# Patient Record
Sex: Male | Born: 1980 | Race: White | Hispanic: No | Marital: Single | Smoking: Former smoker
Health system: Southern US, Community
[De-identification: ages and names within clinical notes are randomized; demographics above are authoritative.]

## PROBLEM LIST (undated history)

## (undated) DIAGNOSIS — A630 Anogenital (venereal) warts: Secondary | ICD-10-CM

## (undated) DIAGNOSIS — K219 Gastro-esophageal reflux disease without esophagitis: Secondary | ICD-10-CM

## (undated) HISTORY — DX: Anogenital (venereal) warts: A63.0

## (undated) HISTORY — PX: TONSILLECTOMY: SUR1361

## (undated) HISTORY — DX: Gastro-esophageal reflux disease without esophagitis: K21.9

---

## 2005-03-22 ENCOUNTER — Ambulatory Visit: Payer: Self-pay | Admitting: Internal Medicine

## 2005-03-29 ENCOUNTER — Ambulatory Visit: Payer: Self-pay | Admitting: *Deleted

## 2005-03-29 ENCOUNTER — Ambulatory Visit: Payer: Self-pay | Admitting: Internal Medicine

## 2005-04-13 ENCOUNTER — Ambulatory Visit: Payer: Self-pay | Admitting: Internal Medicine

## 2005-07-05 ENCOUNTER — Ambulatory Visit: Payer: Self-pay | Admitting: Internal Medicine

## 2005-07-13 ENCOUNTER — Ambulatory Visit: Payer: Self-pay | Admitting: Internal Medicine

## 2005-07-15 ENCOUNTER — Ambulatory Visit: Payer: Self-pay | Admitting: Cardiology

## 2005-07-20 ENCOUNTER — Ambulatory Visit: Payer: Self-pay | Admitting: Family Medicine

## 2005-07-21 ENCOUNTER — Ambulatory Visit: Payer: Self-pay | Admitting: Internal Medicine

## 2005-08-10 ENCOUNTER — Ambulatory Visit: Payer: Self-pay | Admitting: Internal Medicine

## 2005-08-25 ENCOUNTER — Ambulatory Visit: Payer: Self-pay | Admitting: Internal Medicine

## 2015-03-12 ENCOUNTER — Other Ambulatory Visit: Payer: Self-pay | Admitting: Family Medicine

## 2015-03-12 NOTE — Telephone Encounter (Signed)
Needs appointment for PE.

## 2015-03-13 NOTE — Telephone Encounter (Signed)
Can you please get this patient scheduled for an appointment.

## 2015-03-17 ENCOUNTER — Other Ambulatory Visit: Payer: Self-pay | Admitting: Family Medicine

## 2015-03-17 DIAGNOSIS — Z0000 Encounter for general adult medical examination without abnormal findings: Secondary | ICD-10-CM

## 2015-03-18 ENCOUNTER — Encounter: Payer: Self-pay | Admitting: Family Medicine

## 2015-03-18 ENCOUNTER — Ambulatory Visit (INDEPENDENT_AMBULATORY_CARE_PROVIDER_SITE_OTHER): Payer: No Typology Code available for payment source | Admitting: Family Medicine

## 2015-03-18 VITALS — BP 138/96 | HR 98 | Temp 97.7°F | Ht 67.5 in | Wt 192.0 lb

## 2015-03-18 DIAGNOSIS — A630 Anogenital (venereal) warts: Secondary | ICD-10-CM | POA: Insufficient documentation

## 2015-03-18 DIAGNOSIS — K219 Gastro-esophageal reflux disease without esophagitis: Secondary | ICD-10-CM | POA: Insufficient documentation

## 2015-03-18 DIAGNOSIS — Z0000 Encounter for general adult medical examination without abnormal findings: Secondary | ICD-10-CM

## 2015-03-18 DIAGNOSIS — F524 Premature ejaculation: Secondary | ICD-10-CM | POA: Diagnosis not present

## 2015-03-18 DIAGNOSIS — N529 Male erectile dysfunction, unspecified: Secondary | ICD-10-CM | POA: Diagnosis not present

## 2015-03-18 DIAGNOSIS — K625 Hemorrhage of anus and rectum: Secondary | ICD-10-CM | POA: Diagnosis not present

## 2015-03-18 DIAGNOSIS — Z23 Encounter for immunization: Secondary | ICD-10-CM | POA: Diagnosis not present

## 2015-03-18 MED ORDER — OMEPRAZOLE 20 MG PO CPDR: 20.0000 mg | DELAYED_RELEASE_CAPSULE | Freq: Two times a day (BID) | ORAL | Status: DC

## 2015-03-18 NOTE — Assessment & Plan Note (Signed)
Patient is not interested in doing behavioral changes or the stop and squeeze. He states that they "don't work." Not interested in doing SSRI. Will check labs. Wants viagra. Discussed that this does not usually help the problem. He didn't want to discuss it further. If continues, consider referral to urology.

## 2015-03-18 NOTE — Assessment & Plan Note (Signed)
Likely the cause of his rectal bleeding. Will refer to general surgery for evaluation and treatment.

## 2015-03-18 NOTE — Progress Notes (Signed)
BP 138/96 mmHg  Pulse 98  Temp(Src) 97.7 F (36.5 C)  Ht 5' 7.5" (1.715 m)  Wt 192 lb (87.091 kg)  BMI 29.61 kg/m2  SpO2 98%   Subjective:    Patient ID: Jordan House, male    DOB: 12/24/1980, 34 y.o.   MRN: [id number]  HPI: Jordan House is a 34 y.o. male presenting on 03/18/2015 for comprehensive medical examination. Current medical complaints include:  ERECTILE DYSFUNCTION- sometimes finishes too soon,  ED status: years, every time Satisfied with current treatment?: not on anything   Medication side effects:  no Problem with getting erections: no Problem with sustaining erections: yes Poor erectile quality: no Frequency of dysfunction: constant Main problem: premature ejactulation Morning/nocturnal erections: yes Decreased libido: no New medications: none Relationship problems: no Good communication with sexual partner: yes  Satisfaction with current partner: yes  Treatments attempted: waiting  RECTAL BLEEDING Duration: has been going on for over 5 years, Has some genital warts down there Bright red rectal bleeding: yes Amount of blood: sometimes a lot, sometimes a little bit Frequency: once a week Melena: yes Spotting on toilet tissue: yes Anal fullness: yes Perianal pain: no Severity: moderate Perianal irritation/itching: yes Constipation: yes Chronic straining/valsava: yes Hemorrhoids: no Previous colonoscopy: none  GERD GERD control status: uncontrolled  Satisfied with current treatment? no Heartburn frequency: 1x a moth if not more often Medication side effects: no  Medication compliance: fair, comes and goes and sometimes is really bad Previous GERD medications: omeprazole Antacid use frequency: occasionally Duration: chronic Nature: indigestion, burning Location: LUQ Heartburn duration: 1 hour Alleviatiating factors: omeprazole  Aggravating factors: nothing Dysphagia: no Odynophagia:  no Hematemesis: no Blood in stool: yes EGD:  no  He currently lives with: his wife Interim Problems from his last visit: yes  Depression Screen done today and results listed below:  Depression screen [facility] 2/9 03/18/2015  Decreased Interest 0  Down, Depressed, Hopeless 0  PHQ - 2 Score 0    The patient does not have a history of falls. I did not complete a risk assessment for falls. A plan of care for falls was not documented.  Past Medical History:  Past Medical History  Diagnosis Date  . GERD (gastroesophageal reflux disease)   . Genital warts     Surgical History:  Past Surgical History  Procedure Laterality Date  . Tonsillectomy      Medications:  No current outpatient prescriptions on file prior to visit.   No current facility-administered medications on file prior to visit.    Allergies:  No Known Allergies  Social History:  Social History   Social History  . Marital Status: Single    Spouse Name: N/A  . Number of Children: N/A  . Years of Education: N/A   Occupational History  . Not on file.   Social History Main Topics  . Smoking status: Former Smoker    Quit date: 04/27/2011  . Smokeless tobacco: Not on file  . Alcohol Use: No  . Drug Use: No  . Sexual Activity: Yes    Birth Control/ Protection: None   Other Topics Concern  . Not on file   Social History Narrative  . No narrative on file   History  Smoking status  . Former Smoker  . Quit date: 04/27/2011  Smokeless tobacco  . Not on file   History  Alcohol Use No    Family History:  Family History  Problem Relation Age of Onset  . Arthritis Mother   .  Hypertension Father   . Diabetes Maternal Grandmother   . Arthritis Maternal Grandmother   . Heart disease Maternal Grandmother   . Hypertension Paternal Grandmother   . Heart disease Paternal Grandfather    Past medical history, surgical history, medications, allergies, family history and social history reviewed with patient today and changes made to appropriate areas of  the chart.   Review of Systems  Constitutional: Negative.   HENT: Negative for congestion, ear discharge, ear pain, hearing loss, nosebleeds, sore throat and tinnitus.   Eyes: Negative.   Respiratory: Negative.  Negative for stridor.   Cardiovascular: Negative.   Gastrointestinal: Positive for heartburn, abdominal pain, diarrhea, constipation and blood in stool. Negative for nausea, vomiting and melena.  Genitourinary: Negative.   Musculoskeletal: Negative.   Skin: Negative.   Neurological: Positive for headaches (1x a week). Negative for dizziness, tingling, tremors, sensory change, speech change, focal weakness, seizures and loss of consciousness.  Endo/Heme/Allergies: Negative.   Psychiatric/Behavioral: Negative.     All other ROS negative except what is listed above and in the HPI.      Objective:    BP 138/96 mmHg  Pulse 98  Temp(Src) 97.7 F (36.5 C)  Ht 5' 7.5" (1.715 m)  Wt 192 lb (87.091 kg)  BMI 29.61 kg/m2  SpO2 98%  Wt Readings from Last 3 Encounters:  03/18/15 195 lb (88.451 kg)  03/18/15 192 lb (87.091 kg)    Physical Exam  Constitutional: He is oriented to person, place, and time. He appears well-developed and well-nourished. No distress.  HENT:  Head: Normocephalic and atraumatic.  Right Ear: Hearing normal.  Left Ear: Hearing normal.  Nose: Nose normal.  Eyes: Conjunctivae and lids are normal. Right eye exhibits no discharge. Left eye exhibits no discharge. No scleral icterus.  Cardiovascular: Normal rate, regular rhythm, normal heart sounds and intact distal pulses.  Exam reveals no gallop and no friction rub.   No murmur heard. Pulmonary/Chest: Effort normal and breath sounds normal. No respiratory distress. He has no wheezes. He has no rales. He exhibits no tenderness.  Abdominal: Soft. Bowel sounds are normal. He exhibits no distension and no mass. There is no tenderness. There is no rebound and no guarding. Hernia confirmed negative in the right  inguinal area and confirmed negative in the left inguinal area.  Genitourinary: Testes normal.    Cremasteric reflex is present. Right testis shows no mass, no swelling and no tenderness. Right testis is descended. Cremasteric reflex is not absent on the right side. Left testis shows no mass, no swelling and no tenderness. Left testis is descended. Cremasteric reflex is not absent on the left side. Circumcised. No penile tenderness.     Flat warts on sides of penis and on anus  Musculoskeletal: Normal range of motion. He exhibits no edema or tenderness.  Lymphadenopathy:       Right: No inguinal adenopathy present.       Left: No inguinal adenopathy present.  Neurological: He is alert and oriented to person, place, and time. He has normal reflexes. He displays normal reflexes. No cranial nerve deficit. He exhibits normal muscle tone. Coordination normal.  Skin: Skin is warm, dry and intact. No rash noted. No erythema. No pallor.  Psychiatric: He has a normal mood and affect. His speech is normal and behavior is normal. Judgment and thought content normal. Cognition and memory are normal.  Nursing note and vitals reviewed.   No results found for this or any previous visit.  Assessment & Plan:   Problem List Items Addressed This Visit      Digestive   GERD (gastroesophageal reflux disease)    Still not under great control. Will increase to 40mg  daily and check back in in 1 month. If still going on, will refer to GI for further eval and likely EGD.      Relevant Medications   omeprazole (PRILOSEC) 20 MG capsule     Musculoskeletal and Integument   Condylomata acuminata in men    Likely the cause of his rectal bleeding. Will refer to general surgery for evaluation and treatment.       Relevant Orders   Ambulatory referral to General Surgery     Other   Premature ejaculation    Patient is not interested in doing behavioral changes or the stop and squeeze. He states that they  "don't work." Not interested in doing SSRI. Will check labs. Wants viagra. Discussed that this does not usually help the problem. He didn't want to discuss it further. If continues, consider referral to urology.       Other Visit Diagnoses    Routine general medical examination at a health care facility    -  Primary    Tdap tomorrow. Labs tomorrow when he's fasting. Work on diet and exercise. Continue to monitor.     Erectile dysfunction, unspecified erectile dysfunction type        Seems to be more premature ejaculation rather than ED. Will check labs. Information from up to date given to patient today.    Relevant Orders    Testosterone, free, total    Immunization due        Will get Tdap tomorrow. Refused flu.     Rectal bleeding        Likely to due the warts. Referral to general surgeon made today.       Discussed aspirin prophylaxis for myocardial infarction prevention and decision was it was not indicated  LABORATORY TESTING:  Health maintenance labs ordered today as discussed above.   IMMUNIZATIONS:   - Tdap: Tetanus vaccination status reviewed: to be done tomorrow. - Influenza: Refused  PATIENT COUNSELING:    Sexuality: Discussed sexually transmitted diseases, partner selection, use of condoms, avoidance of unintended pregnancy  and contraceptive alternatives.   Advised to avoid cigarette smoking.  I discussed with the patient that most people either abstain from alcohol or drink within safe limits (<=14/week and <=4 drinks/occasion for males, <=7/weeks and <= 3 drinks/occasion for females) and that the risk for alcohol disorders and other health effects rises proportionally with the number of drinks per week and how often a drinker exceeds daily limits.  Discussed cessation/primary prevention of drug use and availability of treatment for abuse.   Diet: Encouraged to adjust caloric intake to maintain  or achieve ideal body weight, to reduce intake of dietary saturated  fat and total fat, to limit sodium intake by avoiding high sodium foods and not adding table salt, and to maintain adequate dietary potassium and calcium preferably from fresh fruits, vegetables, and low-fat dairy products.    stressed the importance of regular exercise  Injury prevention: Discussed safety belts, safety helmets, smoke detector, smoking near bedding or upholstery.   Dental health: Discussed importance of regular tooth brushing, flossing, and dental visits.   Follow up plan: NEXT PREVENTATIVE PHYSICAL DUE IN 1 YEAR. Return in about 3 months (around 06/18/2015) for Follow up on GERD,  Tomorrow for labs.

## 2015-03-18 NOTE — Assessment & Plan Note (Signed)
Still not under great control. Will increase to 40mg  daily and check back in in 1 month. If still going on, will refer to GI for further eval and likely EGD.

## 2015-03-18 NOTE — Patient Instructions (Signed)

## 2015-03-19 ENCOUNTER — Encounter: Payer: Self-pay | Admitting: *Deleted

## 2015-03-19 ENCOUNTER — Other Ambulatory Visit: Payer: No Typology Code available for payment source

## 2015-03-19 DIAGNOSIS — Z0000 Encounter for general adult medical examination without abnormal findings: Secondary | ICD-10-CM

## 2015-03-19 DIAGNOSIS — N529 Male erectile dysfunction, unspecified: Secondary | ICD-10-CM

## 2015-03-19 LAB — UA/M W/RFLX CULTURE, ROUTINE
Bilirubin, UA: NEGATIVE
Glucose, UA: NEGATIVE
Ketones, UA: NEGATIVE
LEUKOCYTES UA: NEGATIVE
NITRITE UA: NEGATIVE
PH UA: 6 (ref 5.0–7.5)
Protein, UA: NEGATIVE
RBC, UA: NEGATIVE
Specific Gravity, UA: 1.025 (ref 1.005–1.030)
Urobilinogen, Ur: 0.2 mg/dL (ref 0.2–1.0)

## 2015-03-20 LAB — CBC WITH DIFFERENTIAL/PLATELET
BASOS ABS: 0 10*3/uL (ref 0.0–0.2)
Basos: 0 %
EOS (ABSOLUTE): 0.2 10*3/uL (ref 0.0–0.4)
Eos: 3 %
Hematocrit: 48.1 % (ref 37.5–51.0)
Hemoglobin: 16.1 g/dL (ref 12.6–17.7)
IMMATURE GRANULOCYTES: 0 %
Immature Grans (Abs): 0 10*3/uL (ref 0.0–0.1)
LYMPHS: 43 %
Lymphocytes Absolute: 3.1 10*3/uL (ref 0.7–3.1)
MCH: 28.2 pg (ref 26.6–33.0)
MCHC: 33.5 g/dL (ref 31.5–35.7)
MCV: 84 fL (ref 79–97)
MONOS ABS: 0.4 10*3/uL (ref 0.1–0.9)
Monocytes: 6 %
NEUTROS PCT: 48 %
Neutrophils Absolute: 3.5 10*3/uL (ref 1.4–7.0)
PLATELETS: 304 10*3/uL (ref 150–379)
RBC: 5.7 x10E6/uL (ref 4.14–5.80)
RDW: 14.1 % (ref 12.3–15.4)
WBC: 7.2 10*3/uL (ref 3.4–10.8)

## 2015-03-20 LAB — COMPREHENSIVE METABOLIC PANEL
A/G RATIO: 1.7 (ref 1.1–2.5)
ALT: 40 IU/L (ref 0–44)
AST: 27 IU/L (ref 0–40)
Albumin: 4.5 g/dL (ref 3.5–5.5)
Alkaline Phosphatase: 100 IU/L (ref 39–117)
BUN/Creatinine Ratio: 18 (ref 8–19)
BUN: 20 mg/dL (ref 6–20)
Bilirubin Total: 0.4 mg/dL (ref 0.0–1.2)
CALCIUM: 9.5 mg/dL (ref 8.7–10.2)
CO2: 25 mmol/L (ref 18–29)
CREATININE: 1.13 mg/dL (ref 0.76–1.27)
Chloride: 99 mmol/L (ref 97–106)
GFR, EST AFRICAN AMERICAN: 96 mL/min/{1.73_m2} (ref >59)
GFR, EST NON AFRICAN AMERICAN: 83 mL/min/{1.73_m2} (ref >59)
GLOBULIN, TOTAL: 2.7 g/dL (ref 1.5–4.5)
Glucose: 120 mg/dL — ABNORMAL HIGH (ref 65–99)
POTASSIUM: 4.4 mmol/L (ref 3.5–5.2)
Sodium: 139 mmol/L (ref 136–144)
TOTAL PROTEIN: 7.2 g/dL (ref 6.0–8.5)

## 2015-03-20 LAB — LIPID PANEL W/O CHOL/HDL RATIO
CHOLESTEROL TOTAL: 211 mg/dL — AB (ref 100–199)
HDL: 32 mg/dL — ABNORMAL LOW (ref >39)
LDL CALC: 121 mg/dL — AB (ref 0–99)
Triglycerides: 290 mg/dL — ABNORMAL HIGH (ref 0–149)
VLDL Cholesterol Cal: 58 mg/dL — ABNORMAL HIGH (ref 5–40)

## 2015-03-20 LAB — TSH: TSH: 3.47 u[IU]/mL (ref 0.450–4.500)

## 2015-03-23 LAB — TESTOSTERONE, FREE, TOTAL, SHBG: TESTOSTERONE FREE: 10.6 pg/mL (ref 8.7–25.1)

## 2015-03-25 ENCOUNTER — Telehealth: Payer: Self-pay | Admitting: Family Medicine

## 2015-03-25 MED ORDER — TESTOSTERONE 50 MG/5GM (1%) TD GEL: 5.0000 g | Freq: Every day | TRANSDERMAL | Status: DC

## 2015-03-25 NOTE — Telephone Encounter (Signed)
Attempted to call patient. Low testosterone. Will start on androgel. Cholesterol and sugar elevated. Work on diet and exercise and return in 1 month for recheck. Please let him know as I couldn't get through.

## 2015-03-27 NOTE — Telephone Encounter (Signed)
Patient called back, he will come get his Rx and schedule 1 month f/u with MJ.  Will work on diet and exercise between now and then.

## 2015-04-08 ENCOUNTER — Telehealth: Payer: Self-pay | Admitting: Family Medicine

## 2015-04-08 DIAGNOSIS — R7989 Other specified abnormal findings of blood chemistry: Secondary | ICD-10-CM

## 2015-04-08 NOTE — Telephone Encounter (Signed)
Forward to provider

## 2015-04-08 NOTE — Telephone Encounter (Signed)
Pt would like to know if there is anything else he can get instead of androgel because his insurance will not cover it.

## 2015-04-08 NOTE — Telephone Encounter (Signed)
We can refer him to urology, who can discuss further options. Referral generated

## 2015-04-15 ENCOUNTER — Ambulatory Visit: Payer: Self-pay | Admitting: General Surgery

## 2015-05-22 ENCOUNTER — Encounter: Payer: Self-pay | Admitting: *Deleted

## 2015-10-30 ENCOUNTER — Other Ambulatory Visit: Payer: Self-pay | Admitting: Family Medicine

## 2017-02-09 ENCOUNTER — Telehealth: Payer: Self-pay | Admitting: Family Medicine

## 2017-02-09 NOTE — Telephone Encounter (Signed)
FYI:  Patient requesting a refill on omeprazole medication. Patient was last seen 03/25/2015.Informed pt. He will need to come in for appt to be seen. Patient states he has no insurance and needs to come in this week due to being fully out of medication for omeprazole that he has been getting from another source.   Patient aware that he will be self-pay and self-pay policies.  Patient scheduled for visit with provider Friday 03/14/2017 at 3:30 pm.

## 2017-02-11 ENCOUNTER — Ambulatory Visit (INDEPENDENT_AMBULATORY_CARE_PROVIDER_SITE_OTHER): Payer: Self-pay | Admitting: Family Medicine

## 2017-02-11 ENCOUNTER — Encounter: Payer: Self-pay | Admitting: Family Medicine

## 2017-02-11 VITALS — BP 129/93 | HR 87 | Temp 98.0°F | Wt 194.0 lb

## 2017-02-11 DIAGNOSIS — K219 Gastro-esophageal reflux disease without esophagitis: Secondary | ICD-10-CM

## 2017-02-11 DIAGNOSIS — E291 Testicular hypofunction: Secondary | ICD-10-CM

## 2017-02-11 MED ORDER — TESTOSTERONE 50 MG/5GM (1%) TD GEL: 5.0000 g | Freq: Every day | TRANSDERMAL | 1 refills | Status: DC

## 2017-02-11 MED ORDER — OMEPRAZOLE 20 MG PO CPDR: DELAYED_RELEASE_CAPSULE | ORAL | 3 refills | Status: DC

## 2017-02-11 NOTE — Assessment & Plan Note (Signed)
Never filled his testosterone. Refill given today. Call with any concerns.

## 2017-02-11 NOTE — Addendum Note (Signed)
Addended by: Dorcas CarrowJOHNSON, Julis Haubner P on: 02/11/2017 04:23 PM   Modules accepted: Orders

## 2017-02-11 NOTE — Progress Notes (Signed)
BP (!) 129/93 (BP Location: Left Arm, Patient Position: Sitting, Cuff Size: Large)   Pulse 87   Temp 98 F (36.7 C)   Wt 194 lb (88 kg)   SpO2 97%   BMI 29.07 kg/m    Subjective:    Patient ID: Jordan House, male    DOB: 06/02/1980, 36 y.o.   MRN: [id number]  HPI: Jordan House is a 36 y.o. male  Chief Complaint  Patient presents with  . Gastroesophageal Reflux   GERD GERD control status: bearble with the medicine, but still having breakthrough symptoms  Satisfied with current treatment? yes Heartburn frequency: daily Medication side effects: no  Medication compliance: excellent Previous GERD medications: omeprazole Antacid use frequency:  never Duration: chronic Nature: indigestion, swirly and crampy Location: whole belly Heartburn duration: several hours Alleviatiating factors:  meds Aggravating factors: nothing Dysphagia: no Odynophagia:  no Hematemesis: no Blood in stool: no EGD: no  Relevant past medical, surgical, family and social history reviewed and updated as indicated. Interim medical history since our last visit reviewed. Allergies and medications reviewed and updated.  Review of Systems  Constitutional: Negative.   Respiratory: Negative.   Cardiovascular: Negative.   Gastrointestinal: Negative.   Psychiatric/Behavioral: Negative.     Per HPI unless specifically indicated above     Objective:    BP (!) 129/93 (BP Location: Left Arm, Patient Position: Sitting, Cuff Size: Large)   Pulse 87   Temp 98 F (36.7 C)   Wt 194 lb (88 kg)   SpO2 97%   BMI 29.07 kg/m   Wt Readings from Last 3 Encounters:  02/11/17 194 lb (88 kg)  03/18/15 195 lb (88.5 kg)  03/18/15 192 lb (87.1 kg)    Physical Exam  Constitutional: He is oriented to person, place, and time. He appears well-developed and well-nourished. No distress.  HENT:  Head: Normocephalic and atraumatic.  Right Ear: Hearing normal.  Left Ear: Hearing normal.  Nose: Nose  normal.  Eyes: Conjunctivae and lids are normal. Right eye exhibits no discharge. Left eye exhibits no discharge. No scleral icterus.  Cardiovascular: Normal rate, regular rhythm, normal heart sounds and intact distal pulses.  Exam reveals no gallop and no friction rub.   No murmur heard. Pulmonary/Chest: Effort normal and breath sounds normal. No respiratory distress. He has no wheezes. He has no rales. He exhibits no tenderness.  Abdominal: Soft. Bowel sounds are normal. He exhibits no distension and no mass. There is no tenderness. There is no rebound and no guarding.  Musculoskeletal: Normal range of motion.  Neurological: He is alert and oriented to person, place, and time.  Skin: Skin is warm, dry and intact. No rash noted. He is not diaphoretic. No erythema. No pallor.  Psychiatric: He has a normal mood and affect. His speech is normal and behavior is normal. Judgment and thought content normal. Cognition and memory are normal.  Nursing note and vitals reviewed.   Results for orders placed or performed in visit on 03/19/15  Testosterone, free, total  Result Value Ref Range   Testosterone, Free 10.6 8.7 - 25.1 pg/mL  UA/M w/rflx Culture, Routine  Result Value Ref Range   Specific Gravity, UA 1.025 1.005 - 1.030   pH, UA 6.0 5.0 - 7.5   Color, UA Yellow Yellow   Appearance Ur Clear Clear   Leukocytes, UA Negative Negative   Protein, UA Negative Negative/Trace   Glucose, UA Negative Negative   Ketones, UA Negative Negative   RBC, UA Negative  Negative   Bilirubin, UA Negative Negative   Urobilinogen, Ur 0.2 0.2 - 1.0 mg/dL   Nitrite, UA Negative Negative  TSH  Result Value Ref Range   TSH 3.470 0.450 - 4.500 uIU/mL  Lipid Panel w/o Chol/HDL Ratio  Result Value Ref Range   Cholesterol, Total 211 (H) 100 - 199 mg/dL   Triglycerides 914 (H) 0 - 149 mg/dL   HDL 32 (L) >78 mg/dL   VLDL Cholesterol Cal 58 (H) 5 - 40 mg/dL   LDL Calculated 295 (H) 0 - 99 mg/dL  Comprehensive  metabolic panel  Result Value Ref Range   Glucose 120 (H) 65 - 99 mg/dL   BUN 20 6 - 20 mg/dL   Creatinine, Ser 6.21 0.76 - 1.27 mg/dL   GFR calc non Af Amer 83 >59 mL/min/1.73   GFR calc Af Amer 96 >59 mL/min/1.73   BUN/Creatinine Ratio 18 8 - 19   Sodium 139 136 - 144 mmol/L   Potassium 4.4 3.5 - 5.2 mmol/L   Chloride 99 97 - 106 mmol/L   CO2 25 18 - 29 mmol/L   Calcium 9.5 8.7 - 10.2 mg/dL   Total Protein 7.2 6.0 - 8.5 g/dL   Albumin 4.5 3.5 - 5.5 g/dL   Globulin, Total 2.7 1.5 - 4.5 g/dL   Albumin/Globulin Ratio 1.7 1.1 - 2.5   Bilirubin Total 0.4 0.0 - 1.2 mg/dL   Alkaline Phosphatase 100 39 - 117 IU/L   AST 27 0 - 40 IU/L   ALT 40 0 - 44 IU/L  CBC with Differential/Platelet  Result Value Ref Range   WBC 7.2 3.4 - 10.8 x10E3/uL   RBC 5.70 4.14 - 5.80 x10E6/uL   Hemoglobin 16.1 12.6 - 17.7 g/dL   Hematocrit 30.8 65.7 - 51.0 %   MCV 84 79 - 97 fL   MCH 28.2 26.6 - 33.0 pg   MCHC 33.5 31.5 - 35.7 g/dL   RDW 84.6 96.2 - 95.2 %   Platelets 304 150 - 379 x10E3/uL   Neutrophils 48 %   Lymphs 43 %   Monocytes 6 %   Eos 3 %   Basos 0 %   Neutrophils Absolute 3.5 1.4 - 7.0 x10E3/uL   Lymphocytes Absolute 3.1 0.7 - 3.1 x10E3/uL   Monocytes Absolute 0.4 0.1 - 0.9 x10E3/uL   EOS (ABSOLUTE) 0.2 0.0 - 0.4 x10E3/uL   Basophils Absolute 0.0 0.0 - 0.2 x10E3/uL   Immature Granulocytes 0 %   Immature Grans (Abs) 0.0 0.0 - 0.1 x10E3/uL      Assessment & Plan:   Problem List Items Addressed This Visit      Digestive   GERD (gastroesophageal reflux disease) - Primary    Not under good control- will refer to GI and continue his omeprazole. Call with any concerns.       Relevant Medications   omeprazole (PRILOSEC) 20 MG capsule   Other Relevant Orders   Ambulatory referral to Gastroenterology     Endocrine   Hypogonadism in male    Never filled his testosterone. Refill given today. Call with any concerns.           Follow up plan: Return in about 1 year (around  02/11/2018) for Physical.

## 2017-02-11 NOTE — Assessment & Plan Note (Signed)
Not under good control- will refer to GI and continue his omeprazole. Call with any concerns.

## 2017-08-08 ENCOUNTER — Encounter: Payer: Self-pay | Admitting: *Deleted

## 2017-08-08 ENCOUNTER — Ambulatory Visit: Payer: Self-pay | Admitting: *Deleted

## 2017-08-08 ENCOUNTER — Other Ambulatory Visit: Payer: Self-pay

## 2017-08-08 ENCOUNTER — Emergency Department
Admission: EM | Admit: 2017-08-08 | Discharge: 2017-08-08 | Disposition: A | Discharge status: Home / Self Care | Payer: PRIVATE HEALTH INSURANCE | Attending: Emergency Medicine | Admitting: Emergency Medicine

## 2017-08-08 DIAGNOSIS — G5630 Lesion of radial nerve, unspecified upper limb: Secondary | ICD-10-CM | POA: Insufficient documentation

## 2017-08-08 DIAGNOSIS — Z79899 Other long term (current) drug therapy: Secondary | ICD-10-CM | POA: Insufficient documentation

## 2017-08-08 DIAGNOSIS — Z87891 Personal history of nicotine dependence: Secondary | ICD-10-CM | POA: Diagnosis not present

## 2017-08-08 DIAGNOSIS — R29898 Other symptoms and signs involving the musculoskeletal system: Secondary | ICD-10-CM | POA: Diagnosis present

## 2017-08-08 NOTE — ED Provider Notes (Signed)
[facility] Emergency Department Provider Note    ____________________________________________   I have reviewed the triage vital signs and the nursing notes.   HISTORY  Chief Complaint Arm Pain   History limited by: Not Limited   HPI Jordan House is a 37 y.o. male who presents to the emergency department today because of concerns for right arm weakness and paresthesias.  Patient states started this morning.  Last night he thinks he might of slept on it awkwardly.  He had the feeling of pins and needles in his arm initially thought it would go away however been persistent throughout the day.  He has also noticed weakness in that right arm.  He denies any change in vision or slurred speech.  Denies any headache.  Denies similar symptoms in the past.   Per medical record review patient has a history of GERD.  Past Medical History:  Diagnosis Date  . Genital warts   . GERD (gastroesophageal reflux disease)     Patient Active Problem List   Diagnosis Date Noted  . Hypogonadism in male 02/11/2017  . Condylomata acuminata in men 03/18/2015  . Premature ejaculation 03/18/2015  . GERD (gastroesophageal reflux disease) 03/18/2015    Past Surgical History:  Procedure Laterality Date  . TONSILLECTOMY      Prior to Admission medications   Medication Sig Start Date End Date Taking? Authorizing Provider  omeprazole (PRILOSEC) 20 MG capsule TAKE 1 TO 2 CAPSULES BY MOUTH EVERY DAY 02/11/17   Johnson, Megan P, DO  testosterone (ANDROGEL) 50 MG/5GM (1%) GEL Place 5 g onto the skin daily. 02/11/17   Dorcas CarrowJohnson, Megan P, DO    Allergies Patient has no known allergies.  Family History  Problem Relation Age of Onset  . Arthritis Mother   . Hypertension Father   . Diabetes Maternal Grandmother   . Arthritis Maternal Grandmother   . Heart disease Maternal Grandmother   . Hypertension Paternal Grandmother   . Heart disease Paternal Grandfather      Social History Social History   Tobacco Use  . Smoking status: Former Smoker    Last attempt to quit: 04/27/2011    Years since quitting: 6.2  . Smokeless tobacco: Never Used  Substance Use Topics  . Alcohol use: No  . Drug use: No    Review of Systems Constitutional: No fever/chills Eyes: No visual changes. ENT: No sore throat. Cardiovascular: Denies chest pain. Respiratory: Denies shortness of breath. Gastrointestinal: No abdominal pain.  No nausea, no vomiting.  No diarrhea.   Genitourinary: Negative for dysuria. Musculoskeletal: Negative for back pain. Skin: Negative for rash. Neurological: Positive for right arm paresthesias and weakness.   ____________________________________________   PHYSICAL EXAM:  VITAL SIGNS: ED Triage Vitals  Enc Vitals Group     BP 08/08/17 1725 120/83     Pulse Rate 08/08/17 1725 86     Resp 08/08/17 1725 20     Temp 08/08/17 1725 99 F (37.2 C)     Temp Source 08/08/17 1725 Oral     SpO2 08/08/17 1725 96 %     Weight 08/08/17 1722 190 lb (86.2 kg)     Height 08/08/17 1722 5\' 10"  (1.778 m)     Head Circumference --      Peak Flow --      Pain Score 08/08/17 1918 0   Constitutional: Alert and oriented. Well appearing and in no distress. Eyes: Conjunctivae are normal.  ENT   Head: Normocephalic and atraumatic.  Nose: No congestion/rhinnorhea.   Mouth/Throat: Mucous membranes are moist.   Neck: No stridor. Hematological/Lymphatic/Immunilogical: No cervical lymphadenopathy. Cardiovascular: Normal rate, regular rhythm.  No murmurs, rubs, or gallops.  Respiratory: Normal respiratory effort without tachypnea nor retractions. Breath sounds are clear and equal bilaterally. No wheezes/rales/rhonchi. Gastrointestinal: Soft and non tender. No rebound. No guarding.  Genitourinary: Deferred Musculoskeletal: Normal range of motion in all extremities. No lower extremity edema. Neurologic:  Right arm with wrist drop. Bicep and  triceps strength preserved. Decreased brachioradialis.  Skin:  Skin is warm, dry and intact. No rash noted. Psychiatric: Mood and affect are normal. Speech and behavior are normal. Patient exhibits appropriate insight and judgment.  ____________________________________________    LABS (pertinent positives/negatives)  None  ____________________________________________   EKG  None  ____________________________________________    RADIOLOGY  None  ____________________________________________   PROCEDURES  Procedures  ____________________________________________   INITIAL IMPRESSION / ASSESSMENT AND PLAN / ED COURSE  Pertinent labs & imaging results that were available during my care of the patient were reviewed by me and considered in my medical decision making (see chart for details).  Presented to the emergency department today because of concerns for right arm some paresthesias after sleeping on it awkwardly.  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  Had a discussion with the patient.  Discussed with patient importance of follow-up with orthopedics.   ____________________________________________   FINAL CLINICAL IMPRESSION(S) / ED DIAGNOSES  Final diagnoses:  Radial nerve palsy     Note: This dictation was prepared with Dragon dictation. Any transcriptional errors that result from this process are unintentional     Phineas Semen, MD 08/08/17 2237

## 2017-08-08 NOTE — Telephone Encounter (Signed)
Nausea and vomiting- yesterday. Patient reports numbness in his R arm.patient went to sleep in his daughter's bed last night- patient has had numbness in his R arm all day.  Advised patient he needs to go to ED- he does not want to go- call to office- provider agrees- patient advised of provider agreement. He may go to [facility].  Reason for Disposition . [1] Numbness (i.e., loss of sensation) of the face, arm / hand, or leg / foot on one side of the body AND [2] sudden onset AND [3] present now  Answer Assessment - Initial Assessment Questions 1. SYMPTOM: "What is the main symptom you are concerned about?" (e.g., weakness, numbness)     Numbness in R arm and hand. Feels like the arm is asleep. Can bend elbow- hard to move wrist and move fingers- weakness 2. ONSET: "When did this start?" (minutes, hours, days; while sleeping)     This morning 3. LAST NORMAL: "When was the last time you were normal (no symptoms)?"     Last before bedtime 4. PATTERN "Does this come and go, or has it been constant since it started?"  "Is it present now?"     Constant- present 5. CARDIAC SYMPTOMS: "Have you had any of the following symptoms: chest pain, difficulty breathing, palpitations?"     no 6. NEUROLOGIC SYMPTOMS: "Have you had any of the following symptoms: headache, dizziness, vision loss, double vision, changes in speech, unsteady on your feet?"     no 7. OTHER SYMPTOMS: "Do you have any other symptoms?"     Patient reports he does feel better today 8. PREGNANCY: "Is there any chance you are pregnant?" "When was your last menstrual period?"     n/a  Protocols used: NEUROLOGIC DEFICIT-A-AH

## 2017-08-08 NOTE — ED Triage Notes (Addendum)
Pt states he put his daughter to bed last night and fell asleep with her.  Pt states he woke up with right arm numbness.  No headache, no slurred speech .  ambulates without diff.     Pt states arm feels tight.   Pt also reports vomiting and diarrhea last night also  Pt alert   Speech clear.

## 2017-08-08 NOTE — Discharge Instructions (Addendum)
Please seek medical attention for any high fevers, chest pain, shortness of breath, change in behavior, persistent vomiting, bloody stool or any other new or concerning symptoms.  

## 2017-08-08 NOTE — ED Notes (Signed)
Per dr Marisa Severinsiadecki, no scans, xrays or labs at this time.

## 2017-08-09 ENCOUNTER — Ambulatory Visit: Payer: Self-pay | Admitting: *Deleted

## 2017-08-09 NOTE — Telephone Encounter (Signed)
Pt is calling for pain med for his arm. He was triaged on yesterday with pain in his right arm was evaluated in the emergency room. He states that had radial nerve damage from sleeping with his arm one position.  His fingers are tingly  and numb. He has taken ibuprofen for discomfort but it did not help much. It was discussed with him in the ED to follow up with an orthopedics.  Advised to take Aleve for discomfort.  Appointment made per protocol.   Reason for Disposition . [1] MODERATE pain (e.g., interferes with normal activities) AND [2] present > 3 days  Answer Assessment - Initial Assessment Questions 1. ONSET: "When did the pain start?"     yesterday 2. LOCATION: "Where is the pain located?"     Right arm 3. PAIN: "How bad is the pain?" (Scale 1-10; or mild, moderate, severe)   - MILD (1-3): doesn't interfere with normal activities   - MODERATE (4-7): interferes with normal activities (e.g., work or school) or awakens from sleep   - SEVERE (8-10): excruciating pain, unable to do any normal activities, unable to hold a cup of water     Pain # 6 or 7 4. WORK OR EXERCISE: "Has there been any recent work or exercise that involved this part of the body?"     Slept on his arm and what up with it 5. CAUSE: "What do you think is causing the arm pain?"     Radial nerve injury 6. OTHER SYMPTOMS: "Do you have any other symptoms?" (e.g., neck pain, swelling, rash, fever, numbness, weakness)     Numbness, weakness 7. PREGNANCY: "Is there any chance you are pregnant?" "When was your last menstrual period?"     no  Protocols used: ARM PAIN-A-AH

## 2017-08-11 ENCOUNTER — Encounter: Payer: Self-pay | Admitting: Family Medicine

## 2017-08-11 ENCOUNTER — Ambulatory Visit
Admission: RE | Admit: 2017-08-11 | Discharge: 2017-08-11 | Disposition: A | Discharge status: Home / Self Care | Payer: PRIVATE HEALTH INSURANCE | Source: Ambulatory Visit | Attending: Family Medicine | Admitting: Family Medicine

## 2017-08-11 ENCOUNTER — Ambulatory Visit (INDEPENDENT_AMBULATORY_CARE_PROVIDER_SITE_OTHER): Payer: PRIVATE HEALTH INSURANCE | Admitting: Family Medicine

## 2017-08-11 VITALS — BP 142/83 | HR 88 | Wt 179.3 lb

## 2017-08-11 DIAGNOSIS — R29898 Other symptoms and signs involving the musculoskeletal system: Secondary | ICD-10-CM | POA: Diagnosis not present

## 2017-08-11 DIAGNOSIS — G5631 Lesion of radial nerve, right upper limb: Secondary | ICD-10-CM

## 2017-08-11 DIAGNOSIS — M79602 Pain in left arm: Secondary | ICD-10-CM | POA: Diagnosis not present

## 2017-08-11 MED ORDER — GABAPENTIN 100 MG PO CAPS: 100.0000 mg | ORAL_CAPSULE | Freq: Three times a day (TID) | ORAL | 3 refills | Status: DC

## 2017-08-11 NOTE — Progress Notes (Signed)
BP (!) 142/83 (BP Location: Left Arm, Patient Position: Sitting, Cuff Size: Normal)   Pulse 88   Wt 179 lb 5 oz (81.3 kg)   SpO2 100%   BMI 25.73 kg/m    Subjective:    Patient ID: Jordan House, male    DOB: [birth year], 37 y.o.   MRN: [id number]  HPI: Jordan House is a 37 y.o. male  Chief Complaint  Patient presents with  . Follow-up    shooting pain in right arm, tingling and numbness.  Strength in wrist has declined tremendously, very uncomfortable.   ER FOLLOW UP Time since discharge: 3 days Hospital/facility: [facility] Diagnosis: Radial nerve palsy Procedures/tests: None Consultants: None New medications: None Discharge instructions:  Follow up with orthopedics Status: fluctuating   ARM PAIN- fell asleep on his arm on Sunday night, slept on his arm over night, went to the ER and he said that he had a radial never palsy Duration: 4 days ago Location: R forearm Mechanism of injury: unknown Onset: sudden Severity: discomfort more than pain  Quality:  shooting Frequency: intermittent Radiation: no Aggravating factors: movement  Alleviating factors: NSAIDs  Status: fluctuating Treatments attempted: ibuprofen  Relief with NSAIDs?:  no Swelling: no Redness: no  Warmth: no Trauma: no Chest pain: no  Shortness of breath: no  Fever: no Decreased sensation: no Paresthesias: yes Weakness: yes   Relevant past medical, surgical, family and social history reviewed and updated as indicated. Interim medical history since our last visit reviewed. Allergies and medications reviewed and updated.  Review of Systems  Constitutional: Negative.   Respiratory: Negative.   Cardiovascular: Negative.   Musculoskeletal: Positive for myalgias. Negative for arthralgias, back pain, gait problem, joint swelling, neck pain and neck stiffness.  Skin: Negative.   Neurological: Positive for weakness and numbness. Negative for dizziness, tremors, seizures, syncope, facial  asymmetry, speech difficulty, light-headedness and headaches.  Hematological: Negative.   Psychiatric/Behavioral: Negative.     Per HPI unless specifically indicated above     Objective:    BP (!) 142/83 (BP Location: Left Arm, Patient Position: Sitting, Cuff Size: Normal)   Pulse 88   Wt 179 lb 5 oz (81.3 kg)   SpO2 100%   BMI 25.73 kg/m   Wt Readings from Last 3 Encounters:  08/11/17 179 lb 5 oz (81.3 kg)  08/08/17 190 lb (86.2 kg)  02/11/17 194 lb (88 kg)    Physical Exam  Constitutional: He is oriented to person, place, and time. He appears well-developed and well-nourished. No distress.  HENT:  Head: Normocephalic and atraumatic.  Right Ear: Hearing normal.  Left Ear: Hearing normal.  Nose: Nose normal.  Eyes: Conjunctivae and lids are normal. Right eye exhibits no discharge. Left eye exhibits no discharge. No scleral icterus.  Cardiovascular: Normal rate, regular rhythm, normal heart sounds and intact distal pulses. Exam reveals no gallop and no friction rub.  No murmur heard. Pulmonary/Chest: Effort normal and breath sounds normal. No stridor. No respiratory distress. He has no wheezes. He has no rales. He exhibits no tenderness.  Musculoskeletal: Normal range of motion. He exhibits no edema, tenderness or deformity.  Neurological: He is alert and oriented to person, place, and time. He displays normal reflexes. A sensory deficit is present. No cranial nerve deficit. He exhibits normal muscle tone. Coordination normal.  Weakness to extension and flexion of wrist and extension and flexion of elbow, sensation decreased over forearm and into hand  Skin: Skin is warm, dry and intact. Capillary refill takes  less than 2 seconds. No rash noted. He is not diaphoretic. No erythema. No pallor.  Psychiatric: He has a normal mood and affect. His speech is normal and behavior is normal. Judgment and thought content normal. Cognition and memory are normal.  Nursing note and vitals  reviewed.   Results for orders placed or performed in visit on 03/19/15  Testosterone, free, total  Result Value Ref Range   Testosterone, Free 10.6 8.7 - 25.1 pg/mL  UA/M w/rflx Culture, Routine  Result Value Ref Range   Specific Gravity, UA 1.025 1.005 - 1.030   pH, UA 6.0 5.0 - 7.5   Color, UA Yellow Yellow   Appearance Ur Clear Clear   Leukocytes, UA Negative Negative   Protein, UA Negative Negative/Trace   Glucose, UA Negative Negative   Ketones, UA Negative Negative   RBC, UA Negative Negative   Bilirubin, UA Negative Negative   Urobilinogen, Ur 0.2 0.2 - 1.0 mg/dL   Nitrite, UA Negative Negative  TSH  Result Value Ref Range   TSH 3.470 0.450 - 4.500 uIU/mL  Lipid Panel w/o Chol/HDL Ratio  Result Value Ref Range   Cholesterol, Total 211 (H) 100 - 199 mg/dL   Triglycerides 161 (H) 0 - 149 mg/dL   HDL 32 (L) >09 mg/dL   VLDL Cholesterol Cal 58 (H) 5 - 40 mg/dL   LDL Calculated 604 (H) 0 - 99 mg/dL  Comprehensive metabolic panel  Result Value Ref Range   Glucose 120 (H) 65 - 99 mg/dL   BUN 20 6 - 20 mg/dL   Creatinine, Ser 5.40 0.76 - 1.27 mg/dL   GFR calc non Af Amer 83 >59 mL/min/1.73   GFR calc Af Amer 96 >59 mL/min/1.73   BUN/Creatinine Ratio 18 8 - 19   Sodium 139 136 - 144 mmol/L   Potassium 4.4 3.5 - 5.2 mmol/L   Chloride 99 97 - 106 mmol/L   CO2 25 18 - 29 mmol/L   Calcium 9.5 8.7 - 10.2 mg/dL   Total Protein 7.2 6.0 - 8.5 g/dL   Albumin 4.5 3.5 - 5.5 g/dL   Globulin, Total 2.7 1.5 - 4.5 g/dL   Albumin/Globulin Ratio 1.7 1.1 - 2.5   Bilirubin Total 0.4 0.0 - 1.2 mg/dL   Alkaline Phosphatase 100 39 - 117 IU/L   AST 27 0 - 40 IU/L   ALT 40 0 - 44 IU/L  CBC with Differential/Platelet  Result Value Ref Range   WBC 7.2 3.4 - 10.8 x10E3/uL   RBC 5.70 4.14 - 5.80 x10E6/uL   Hemoglobin 16.1 12.6 - 17.7 g/dL   Hematocrit 98.1 19.1 - 51.0 %   MCV 84 79 - 97 fL   MCH 28.2 26.6 - 33.0 pg   MCHC 33.5 31.5 - 35.7 g/dL   RDW 47.8 29.5 - 62.1 %   Platelets 304  150 - 379 x10E3/uL   Neutrophils 48 %   Lymphs 43 %   Monocytes 6 %   Eos 3 %   Basos 0 %   Neutrophils Absolute 3.5 1.4 - 7.0 x10E3/uL   Lymphocytes Absolute 3.1 0.7 - 3.1 x10E3/uL   Monocytes Absolute 0.4 0.1 - 0.9 x10E3/uL   EOS (ABSOLUTE) 0.2 0.0 - 0.4 x10E3/uL   Basophils Absolute 0.0 0.0 - 0.2 x10E3/uL   Immature Granulocytes 0 %   Immature Grans (Abs) 0.0 0.0 - 0.1 x10E3/uL      Assessment & Plan:   Problem List Items Addressed This Visit    None  Visit Diagnoses    Right arm weakness    -  Primary   Will obtain x-rays to look for cause. Will start gabapentin for pain, referral to neurology made today. Call with any concerns.    Relevant Orders   DG Forearm Right   DG Cervical Spine Complete   Ambulatory referral to Orthopedic Surgery   Radial nerve palsy, right       Likely, but will obtain x-rays to look for any other cause. Await results. Start gabapentin for discomfort.    Relevant Medications   gabapentin (NEURONTIN) 100 MG capsule   Other Relevant Orders   DG Forearm Right   DG Cervical Spine Complete   Ambulatory referral to Orthopedic Surgery       Follow up plan: Return if symptoms worsen or fail to improve.

## 2017-08-11 NOTE — Patient Instructions (Addendum)
[facility] Address: [address] [place], [place] [address]  Phone: [phone] Monday 08/15/17 1:30PM  Radial Nerve Palsy The loss of function of the radial nerve in your arm or hand is called radial nerve palsy. The radial nerve extends from your shoulder, around the back of your upper arm, and down along the outside of your lower arm. An injury to this nerve causes certain muscles and tendons in your arm and wrist to not work properly, which leads to a condition known as wrist drop. This means that you cannot extend your wrist. If you are standing with your arm stretched straight out in front of you, your wrist will bend and your hand will drop down toward the floor. An injury to the radial nerve may also result in lost feeling (sensation) in parts of your arm. What are the causes? Common causes of this condition include:  A break (fracture) of your upper arm bone (humerus) or the upper part of one of the lower arm bones (radius). This is because the radial nerve winds around both of these bones.  Complications of the surgical repair of a fractured humerus or radius.  Improper use of crutches. Crutches that are too long can put pressure on the radial nerve where it passes through the armpit (crutch palsy).  Keeping your arm in a position for a long time that places pressure on the radial nerve, such as having your arm over the back of a chair, or during sleep (Saturday night palsy).  Performing repetitive activities that involve rotation of your lower arm or movement of your wrist (repetitive use injury).  What increases the risk? This condition is more likely to develop if you have:  Diabetes.  Rheumatoid arthritis.  Hypothyroidism.  What are the signs or symptoms? Symptoms of this condition include:  Being unable to extend your wrist.  Having difficulty straightening out your elbow in addition to your wrist.  Having numbness or tingling in the back of your  arm, forearm, or hand.  How is this diagnosed? This condition is diagnosed with a thorough history of the injury or the circumstances that led up to the wrist drop. Your health care provider will examine your shoulder, arm, wrist, and hand. To confirm the diagnosis, you may also have tests, including:  Nerve conduction studies. These tests show if the radial nerve is sending electrical signals well. If not, the test can be used to figure out where the problem is within the radial nerve.  X-rays. These may be done if your health care provider suspects that you have an injury to one or more bones in your arm.  MRI. This may be used to determine the cause of your radial nerve palsy, or to make sure there is not another condition causing your symptoms.  How is this treated? Treatment for this condition depends on the cause.  Removing pressure on the radial nerve. This is done if the condition is caused by pressure on the nerve. Using this treatment may allow the nerve to go back to normal within a few weeks or a few months.  Physical and occupational therapy. Your health care provider may ask you to work with a therapist to regain strength in your hand and wrist and perform stretching exercises to maintain range of motion of your wrist and elbow.  Splinting. Your therapist may make one or more splints for you to wear during the day or at night to help with motion and positioning of  your wrist.  Medicines. ? A steroid injection may be used to decrease swelling around the nerve. ? Nonsteroidal anti-inflammatory drugs (NSAIDs) may be used to control pain.  Surgery. Depending on the cause of your radial nerve palsy, surgery may be required to: ? Remove pressure on the nerve (entrapment). ? Repair one or more broken bones. ? Relocate (transfer) tendons in your lower arm to help you regain strength and mobility of your wrist. ? Transfer of a nerve to the injury site to restore nerve  function.  Follow these instructions at home:  Follow your health care provider's instructions about how to protect your hand and wrist.  If you have been given one or more braces to wear, use them as directed by your health care provider or therapist.  Protect your hand from extreme temperature injuries, such as burns and frostbite, as directed by your health care provider or therapist.  Exercise your hand, wrist, and arm on a regular basis as directed by your health care provider or therapist. Contact a health care provider if:  You have a sudden increase in pain.  You develop new numbness or new loss of sensation in your hand.  You have a sudden change in your ability to move your arm, wrist, or hand.  Your fingers become bluish in color or they feel cold to the touch. This information is not intended to replace advice given to you by your health care provider. Make sure you discuss any questions you have with your health care provider. Document Released: 12/17/2005 Document Revised: 09/18/2015 Document Reviewed: 06/27/2013 Elsevier Interactive Patient Education  2018 [employer]

## 2017-08-12 ENCOUNTER — Telehealth: Payer: Self-pay | Admitting: Family Medicine

## 2017-08-12 NOTE — Telephone Encounter (Signed)
Copied from CRM [id number]. Topic: Quick Communication - See Telephone Encounter >> Aug 12, 2017 11:49 AM Lorrine KinMcGee, Braydan Marriott B, NT wrote: CRM for notification. See Telephone encounter for: 08/12/17. Patient calling to inquire about xray results from yesterday(08/11/17). Please advise. Can leave a voicemail if there is no answer. CB#: [phone]

## 2017-08-12 NOTE — Telephone Encounter (Signed)
Routing to provider  

## 2017-08-12 NOTE — Telephone Encounter (Signed)
Please let him know that his x-rays came back normal. Thank you!

## 2017-08-12 NOTE — Telephone Encounter (Signed)
Patient notified

## 2018-08-07 ENCOUNTER — Other Ambulatory Visit: Payer: Self-pay | Admitting: Family Medicine

## 2018-08-07 NOTE — Telephone Encounter (Signed)
Needs follow up appointment. Has not been seen in 1 year

## 2018-08-07 NOTE — Telephone Encounter (Signed)
Requested medication (s) are due for refill today: Yes  Requested medication (s) are on the active medication list: Yes  Last refill:  02/11/17  Future visit scheduled: No  Notes to clinic:  Prescription has expired.    Requested Prescriptions  Pending Prescriptions Disp Refills   omeprazole (PRILOSEC) 20 MG capsule [Pharmacy Med Name: OMEPRAZOLE DR 20 MG CAPSULE] 90 capsule 0    Sig: TAKE ONE TO TWO CAPSULES DAILY.     Gastroenterology: Proton Pump Inhibitors Failed - 08/07/2018  9:59 AM      Failed - Valid encounter within last 12 months    Recent Outpatient Visits          12 months ago Right arm weakness   [facility] [place], Megan P, DO   1 year ago Gastroesophageal reflux disease, esophagitis presence not specified   [facility] [place], Megan P, DO   3 years ago Routine general medical examination at a health care facility   [facility], [place], DO

## 2018-08-07 NOTE — Telephone Encounter (Signed)
Called pt no answer, left voicemail. °

## 2018-08-07 NOTE — Telephone Encounter (Signed)
Patient said that he currently doesn't have any insurance to pay for an office visit so if he could just have this medication called in.

## 2018-08-07 NOTE — Telephone Encounter (Signed)
Requested medication (s) are due for refill today: Yes  Requested medication (s) are on the active medication list: Yes  Last refill:  01/2017  Future visit scheduled: No  Notes to clinic:  Expired, unable to refill, appointment needed     Requested Prescriptions  Pending Prescriptions Disp Refills   omeprazole (PRILOSEC) 20 MG capsule [Pharmacy Med Name: OMEPRAZOLE DR 20 MG CAPSULE] 90 capsule 0    Sig: TAKE ONE TO TWO CAPSULES DAILY.     Gastroenterology: Proton Pump Inhibitors Failed - 08/07/2018 10:40 AM      Failed - Valid encounter within last 12 months    Recent Outpatient Visits          12 months ago Right arm weakness   [facility] [place], Megan P, DO   1 year ago Gastroesophageal reflux disease, esophagitis presence not specified   [facility] [place], Megan P, DO   3 years ago Routine general medical examination at a health care facility   [facility], [place], DO

## 2018-08-08 ENCOUNTER — Encounter: Payer: Self-pay | Admitting: Family Medicine

## 2018-08-08 NOTE — Telephone Encounter (Signed)
Called pt, lvm.Marland KitchenMarland KitchenLetter printed to mail.

## 2018-08-08 NOTE — Telephone Encounter (Signed)
That medication is available OTC. He can get it that way if he doesn't want to come in/do a virtual visit

## 2018-08-08 NOTE — Telephone Encounter (Signed)
Patient returned call to office regarding medication, advised him that he would need an appointment. Patient declined appointment stating that he has no insurance and does not feel like he needs to be seen for a refill of an acid reflux mediation. I explained webex visit to patient and he stated he will call back and then disconnected the call.

## 2019-02-28 IMAGING — DX DG CERVICAL SPINE COMPLETE 4+V
6 series · 6 of 6 positions shown · non-contrast
Comparison: None.

CLINICAL DATA: RIGHT arm weakness.

EXAM:
CERVICAL SPINE - COMPLETE 4+ VIEW

[c-spine lat]
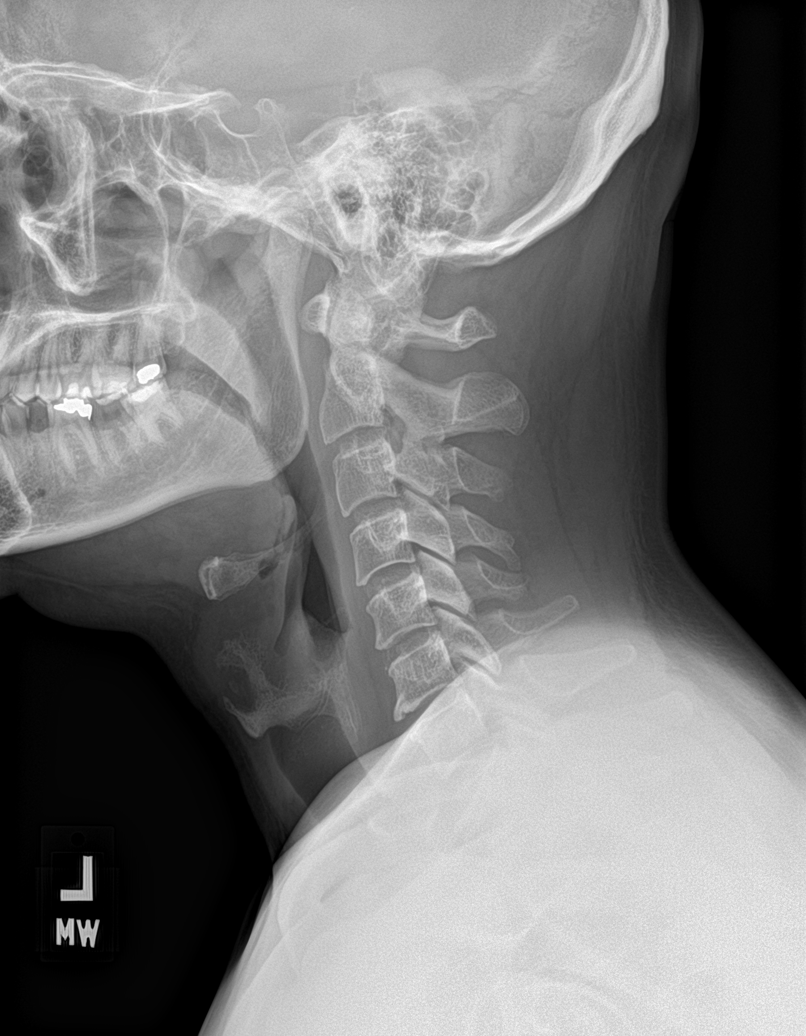

[c-spine obl (1 of 2)]
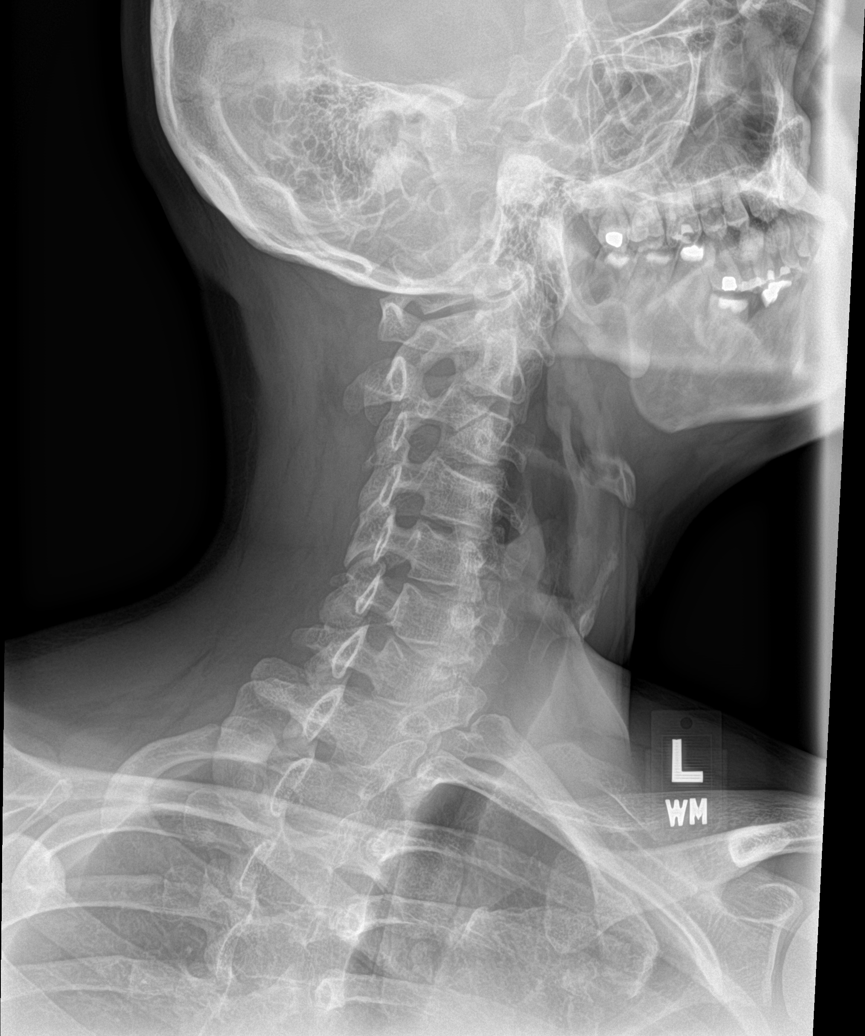

[c-spine obl (2 of 2)]
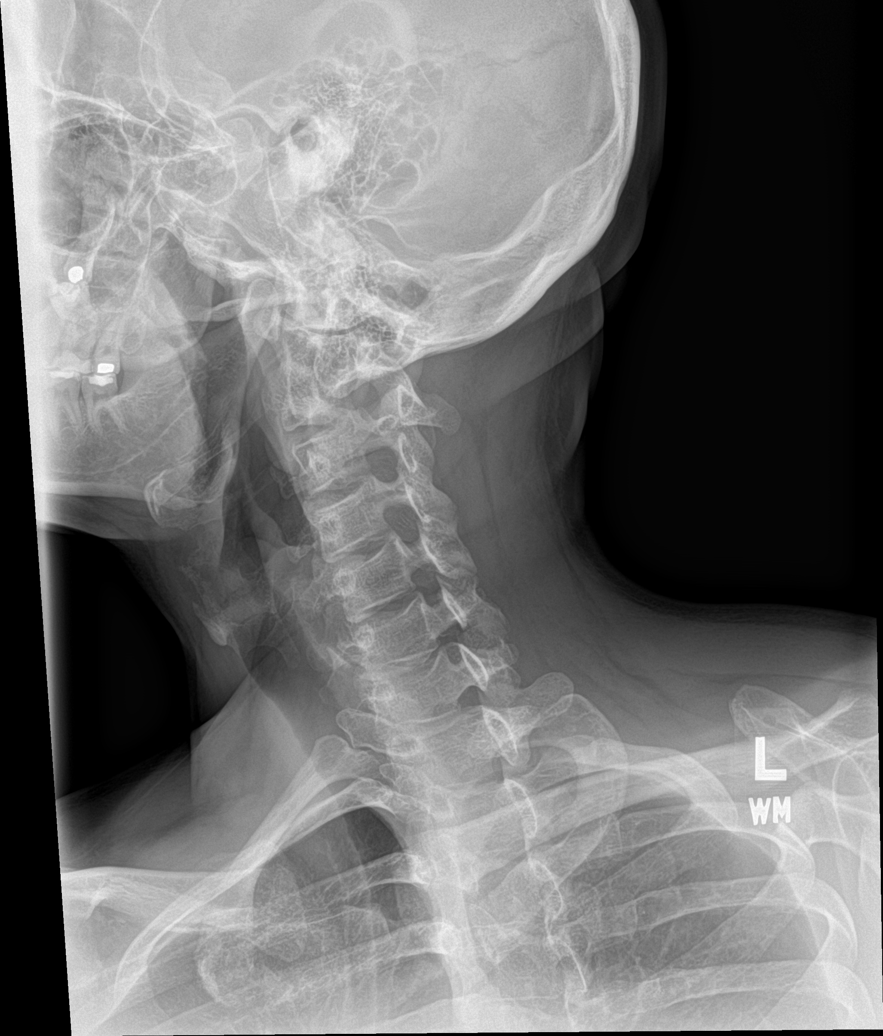

[c-spine ap]
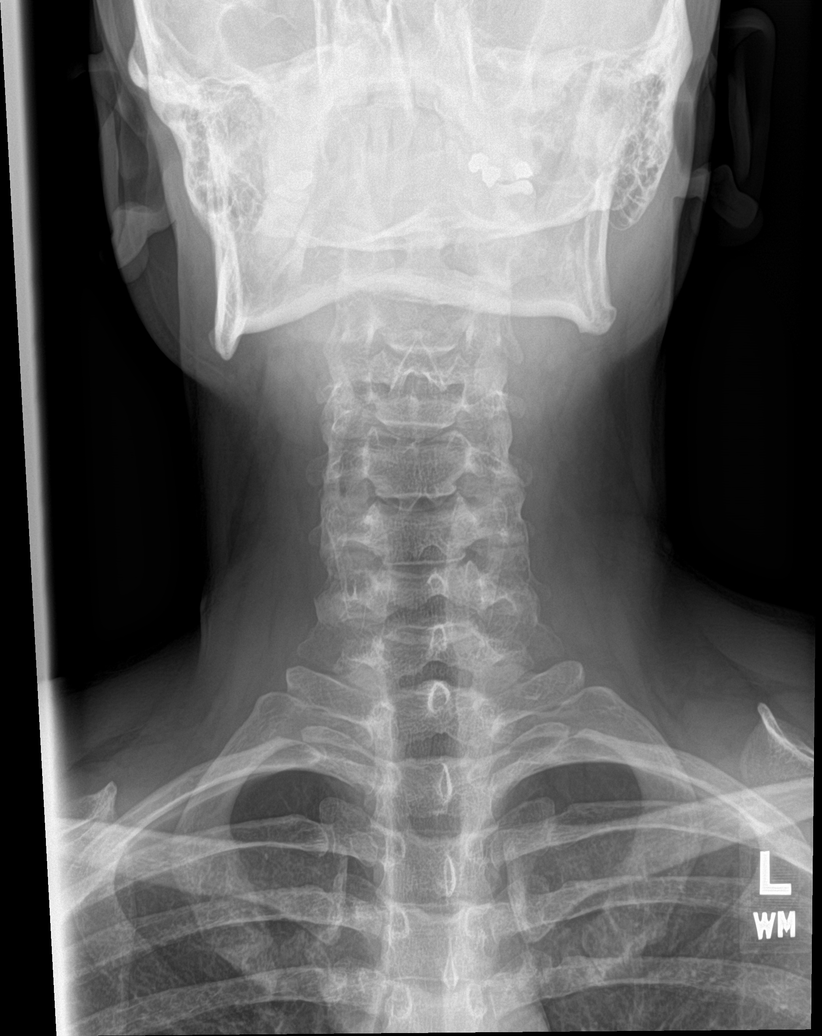

[c-spine open mouth]
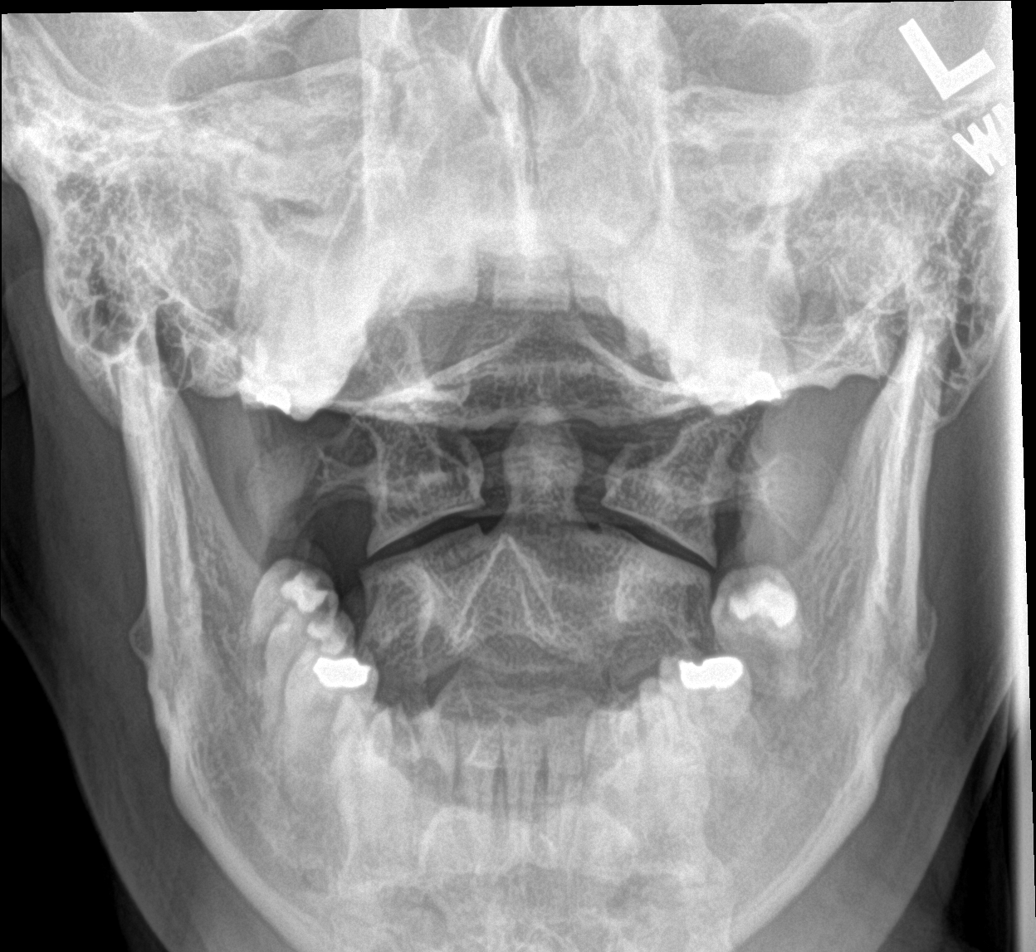

[c-spine swimmers]
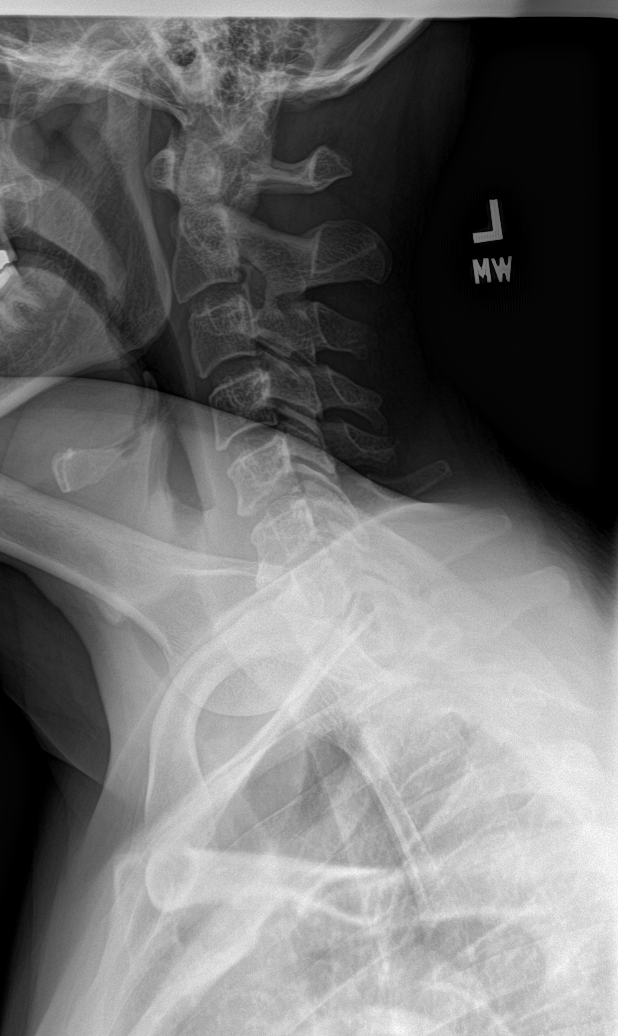

[6 of 6 positions shown; findings below may reference images not displayed]

FINDINGS: There is no evidence of cervical spine fracture or prevertebral soft
tissue swelling. Alignment is normal. No other significant bone
abnormalities are identified. No significant degenerative change at
any level.
IMPRESSION: Negative cervical spine radiographs.

## 2020-01-08 ENCOUNTER — Ambulatory Visit (INDEPENDENT_AMBULATORY_CARE_PROVIDER_SITE_OTHER): Payer: 59 | Admitting: Family Medicine

## 2020-01-08 ENCOUNTER — Encounter: Payer: Self-pay | Admitting: Family Medicine

## 2020-01-08 ENCOUNTER — Other Ambulatory Visit: Payer: Self-pay

## 2020-01-08 VITALS — BP 118/72 | HR 79 | Temp 98.0°F | Ht 69.0 in | Wt 159.0 lb

## 2020-01-08 DIAGNOSIS — R42 Dizziness and giddiness: Secondary | ICD-10-CM | POA: Diagnosis not present

## 2020-01-08 MED ORDER — ONDANSETRON 4 MG PO TBDP: 4.0000 mg | ORAL_TABLET | Freq: Three times a day (TID) | ORAL | 0 refills | Status: DC | PRN

## 2020-01-08 MED ORDER — MECLIZINE HCL 25 MG PO TABS: 25.0000 mg | ORAL_TABLET | Freq: Three times a day (TID) | ORAL | 0 refills | Status: DC | PRN

## 2020-01-08 NOTE — Patient Instructions (Signed)
Vertigo Vertigo is the feeling that you or your surroundings are moving when they are not. This feeling can come and go at any time. Vertigo often goes away on its own. Vertigo can be dangerous if it occurs while you are doing something that could endanger you or others, such as driving or operating machinery. Your health care provider will do tests to determine the cause of your vertigo. Tests will also help your health care provider decide how best to treat your condition. Follow these instructions at home: Eating and drinking      Drink enough fluid to keep your urine pale yellow.  Do not drink alcohol. Activity  Return to your normal activities as told by your health care provider. Ask your health care provider what activities are safe for you.  In the morning, first sit up on the side of the bed. When you feel okay, stand slowly while you hold onto something until you know that your balance is fine.  Move slowly. Avoid sudden body or head movements or certain positions, as told by your health care provider.  If you have trouble walking or keeping your balance, try using a cane for stability. If you feel dizzy or unstable, sit down right away.  Avoid doing any tasks that would cause danger to you or others if vertigo occurs.  Avoid bending down if you feel dizzy. Place items in your home so that they are easy for you to reach without leaning over.  Do not drive or use heavy machinery if you feel dizzy. General instructions  Take over-the-counter and prescription medicines only as told by your health care provider.  Keep all follow-up visits as told by your health care provider. This is important. Contact a health care provider if:  Your medicines do not relieve your vertigo or they make it worse.  You have a fever.  Your condition gets worse or you develop new symptoms.  Your family or friends notice any behavioral changes.  Your nausea or vomiting gets worse.  You  have numbness or a prickling and tingling sensation in part of your body. Get help right away if you:  Have difficulty moving or speaking.  Are always dizzy.  Faint.  Develop severe headaches.  Have weakness in your hands, arms, or legs.  Have changes in your hearing or vision.  Develop a stiff neck.  Develop sensitivity to light. Summary  Vertigo is the feeling that you or your surroundings are moving when they are not.  Your health care provider will do tests to determine the cause of your vertigo.  Follow instructions for home care. You may be told to avoid certain tasks, positions, or movements.  Contact a health care provider if your medicines do not relieve your symptoms, or if you have a fever, nausea, vomiting, or changes in behavior.  Get help right away if you have severe headaches or difficulty speaking, or you develop hearing or vision problems. This information is not intended to replace advice given to you by your health care provider. Make sure you discuss any questions you have with your health care provider. Document Revised: 03/06/2018 Document Reviewed: 03/06/2018 Elsevier Patient Education  2020 [employer]    How to Perform the Epley Maneuver The Epley maneuver is an exercise that relieves symptoms of vertigo. Vertigo is the feeling that you or your surroundings are moving when they are not. When you feel vertigo, you may feel like the room is spinning and have trouble   walking. Dizziness is a little different than vertigo. When you are dizzy, you may feel unsteady or light-headed. You can do this maneuver at home whenever you have symptoms of vertigo. You can do it up to 3 times a day until your symptoms go away. Even though the Epley maneuver may relieve your vertigo for a few weeks, it is possible that your symptoms will return. This maneuver relieves vertigo, but it does not relieve dizziness. What are the risks? If it is done correctly, the Epley  maneuver is considered safe. Sometimes it can lead to dizziness or nausea that goes away after a short time. If you develop other symptoms, such as changes in vision, weakness, or numbness, stop doing the maneuver and call your health care provider. How to perform the Epley maneuver 1. Sit on the edge of a bed or table with your back straight and your legs extended or hanging over the edge of the bed or table. 2. Turn your head halfway toward the affected ear or side. 3. Lie backward quickly with your head turned until you are lying flat on your back. You may want to position a pillow under your shoulders. 4. Hold this position for 30 seconds. You may experience an attack of vertigo. This is normal. 5. Turn your head to the opposite direction until your unaffected ear is facing the floor. 6. Hold this position for 30 seconds. You may experience an attack of vertigo. This is normal. Hold this position until the vertigo stops. 7. Turn your whole body to the same side as your head. Hold for another 30 seconds. 8. Sit back up. You can repeat this exercise up to 3 times a day. Follow these instructions at home:  After doing the Epley maneuver, you can return to your normal activities.  Ask your health care provider if there is anything you should do at home to prevent vertigo. He or she may recommend that you: ? Keep your head raised (elevated) with two or more pillows while you sleep. ? Do not sleep on the side of your affected ear. ? Get up slowly from bed. ? Avoid sudden movements during the day. ? Avoid extreme head movement, like looking up or bending over. Contact a health care provider if:  Your vertigo gets worse.  You have other symptoms, including: ? Nausea. ? Vomiting. ? Headache. Get help right away if:  You have vision changes.  You have a severe or worsening headache or neck pain.  You cannot stop vomiting.  You have new numbness or weakness in any part of your  body. Summary  Vertigo is the feeling that you or your surroundings are moving when they are not.  The Epley maneuver is an exercise that relieves symptoms of vertigo.  If the Epley maneuver is done correctly, it is considered safe. You can do it up to 3 times a day. This information is not intended to replace advice given to you by your health care provider. Make sure you discuss any questions you have with your health care provider. Document Revised: 03/25/2017 Document Reviewed: 03/02/2016 Elsevier Patient Education  2020 [employer] Inc.  
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
## 2020-05-02 ENCOUNTER — Ambulatory Visit (INDEPENDENT_AMBULATORY_CARE_PROVIDER_SITE_OTHER): Payer: 59 | Admitting: Family Medicine

## 2020-05-02 ENCOUNTER — Encounter: Payer: Self-pay | Admitting: Family Medicine

## 2020-05-02 ENCOUNTER — Other Ambulatory Visit: Payer: Self-pay

## 2020-05-02 VITALS — BP 120/76 | HR 74 | Wt 162.0 lb

## 2020-05-02 DIAGNOSIS — K2900 Acute gastritis without bleeding: Secondary | ICD-10-CM | POA: Diagnosis not present

## 2020-05-02 MED ORDER — SUCRALFATE 1 G PO TABS: 1.0000 g | ORAL_TABLET | Freq: Three times a day (TID) | ORAL | 3 refills | Status: DC

## 2020-05-02 MED ORDER — PANTOPRAZOLE SODIUM 40 MG PO TBEC: 40.0000 mg | DELAYED_RELEASE_TABLET | Freq: Every day | ORAL | 3 refills | Status: DC

## 2020-05-02 NOTE — Progress Notes (Signed)
BP 120/76   Pulse 74   Wt 162 lb (73.5 kg)   SpO2 97%   BMI 23.92 kg/m    Subjective:    Patient ID: Jordan House, male    DOB: February 21, 1981, 40 y.o.   MRN: [id number]  HPI: Jordan House is a 40 y.o. male  Chief Complaint  Patient presents with  . Gastroesophageal Reflux    Pt states he has been dealing with acid reflux for many years, feels like omeprazole is not working anymore. Has been having stomach ache for 3 weeks now, and burping a lot. Pt states his stools have been inconsistent sometimes they are loose sometimes he feels constipated.    GERD Duration: chronic, SIGNIFICANTLY worse for the past 3 weeks GERD control status: exacerbated  Satisfied with current treatment? no Heartburn frequency: constant Medication side effects: no  Medication compliance: excellent Previous GERD medications: omprazole not taking Antacid use frequency:  never Ashby Dawes: burning  Location: stomach and esophagus- usually in the stomach Heartburn duration: constant Alleviatiating factors:  nothing Aggravating factors: nothing Dysphagia: no Odynophagia:  no Hematemesis: no Blood in stool: no EGD: no  Relevant past medical, surgical, family and social history reviewed and updated as indicated. Interim medical history since our last visit reviewed. Allergies and medications reviewed and updated.  Review of Systems  Constitutional: Negative.   HENT: Negative.   Respiratory: Negative.   Cardiovascular: Negative.   Gastrointestinal: Positive for constipation and diarrhea. Negative for abdominal distention, abdominal pain, anal bleeding, blood in stool, nausea, rectal pain and vomiting.  Psychiatric/Behavioral: Negative.     Per HPI unless specifically indicated above     Objective:    BP 120/76   Pulse 74   Wt 162 lb (73.5 kg)   SpO2 97%   BMI 23.92 kg/m   Wt Readings from Last 3 Encounters:  05/02/20 162 lb (73.5 kg)  01/08/20 159 lb (72.1 kg)  08/11/17 179 lb 5  oz (81.3 kg)    Physical Exam Vitals and nursing note reviewed.  Constitutional:      General: He is not in acute distress.    Appearance: Normal appearance. He is not ill-appearing, toxic-appearing or diaphoretic.  HENT:     Head: Normocephalic and atraumatic.     Right Ear: External ear normal.     Left Ear: External ear normal.     Nose: Nose normal.     Mouth/Throat:     Mouth: Mucous membranes are moist.     Pharynx: Oropharynx is clear.  Eyes:     General: No scleral icterus.       Right eye: No discharge.        Left eye: No discharge.     Extraocular Movements: Extraocular movements intact.     Conjunctiva/sclera: Conjunctivae normal.     Pupils: Pupils are equal, round, and reactive to light.  Cardiovascular:     Rate and Rhythm: Normal rate and regular rhythm.     Pulses: Normal pulses.     Heart sounds: Normal heart sounds. No murmur heard. No friction rub. No gallop.   Pulmonary:     Effort: Pulmonary effort is normal. No respiratory distress.     Breath sounds: Normal breath sounds. No stridor. No wheezing, rhonchi or rales.  Chest:     Chest wall: No tenderness.  Abdominal:     General: Abdomen is flat. Bowel sounds are normal. There is no distension.     Palpations: Abdomen is soft. There is no  mass.     Tenderness: There is abdominal tenderness (epigastric). There is no right CVA tenderness, left CVA tenderness, guarding or rebound.     Hernia: No hernia is present.  Musculoskeletal:        General: Normal range of motion.     Cervical back: Normal range of motion and neck supple.  Skin:    General: Skin is warm and dry.     Capillary Refill: Capillary refill takes less than 2 seconds.     Coloration: Skin is not jaundiced or pale.     Findings: No bruising, erythema, lesion or rash.  Neurological:     General: No focal deficit present.     Mental Status: He is alert and oriented to person, place, and time. Mental status is at baseline.  Psychiatric:         Mood and Affect: Mood normal.        Behavior: Behavior normal.        Thought Content: Thought content normal.        Judgment: Judgment normal.     Results for orders placed or performed in visit on 03/19/15  Testosterone, free, total  Result Value Ref Range   Testosterone, Free 10.6 8.7 - 25.1 pg/mL  UA/M w/rflx Culture, Routine   Specimen: Urine   URINE  Result Value Ref Range   Specific Gravity, UA 1.025 1.005 - 1.030   pH, UA 6.0 5.0 - 7.5   Color, UA Yellow Yellow   Appearance Ur Clear Clear   Leukocytes, UA Negative Negative   Protein, UA Negative Negative/Trace   Glucose, UA Negative Negative   Ketones, UA Negative Negative   RBC, UA Negative Negative   Bilirubin, UA Negative Negative   Urobilinogen, Ur 0.2 0.2 - 1.0 mg/dL   Nitrite, UA Negative Negative  TSH  Result Value Ref Range   TSH 3.470 0.450 - 4.500 uIU/mL  Lipid Panel w/o Chol/HDL Ratio  Result Value Ref Range   Cholesterol, Total 211 (H) 100 - 199 mg/dL   Triglycerides 144 (H) 0 - 149 mg/dL   HDL 32 (L) >31 mg/dL   VLDL Cholesterol Cal 58 (H) 5 - 40 mg/dL   LDL Calculated 540 (H) 0 - 99 mg/dL  Comprehensive metabolic panel  Result Value Ref Range   Glucose 120 (H) 65 - 99 mg/dL   BUN 20 6 - 20 mg/dL   Creatinine, Ser 0.86 0.76 - 1.27 mg/dL   GFR calc non Af Amer 83 >59 mL/min/1.73   GFR calc Af Amer 96 >59 mL/min/1.73   BUN/Creatinine Ratio 18 8 - 19   Sodium 139 136 - 144 mmol/L   Potassium 4.4 3.5 - 5.2 mmol/L   Chloride 99 97 - 106 mmol/L   CO2 25 18 - 29 mmol/L   Calcium 9.5 8.7 - 10.2 mg/dL   Total Protein 7.2 6.0 - 8.5 g/dL   Albumin 4.5 3.5 - 5.5 g/dL   Globulin, Total 2.7 1.5 - 4.5 g/dL   Albumin/Globulin Ratio 1.7 1.1 - 2.5   Bilirubin Total 0.4 0.0 - 1.2 mg/dL   Alkaline Phosphatase 100 39 - 117 IU/L   AST 27 0 - 40 IU/L   ALT 40 0 - 44 IU/L  CBC with Differential/Platelet  Result Value Ref Range   WBC 7.2 3.4 - 10.8 x10E3/uL   RBC 5.70 4.14 - 5.80 x10E6/uL   Hemoglobin  16.1 12.6 - 17.7 g/dL   Hematocrit 76.1 95.0 - 51.0 %  MCV 84 79 - 97 fL   MCH 28.2 26.6 - 33.0 pg   MCHC 33.5 31.5 - 35.7 g/dL   RDW 14.1 12.3 - 15.4 %   Platelets 304 150 - 379 x10E3/uL   Neutrophils 48 %   Lymphs 43 %   Monocytes 6 %   Eos 3 %   Basos 0 %   Neutrophils Absolute 3.5 1.4 - 7.0 x10E3/uL   Lymphocytes Absolute 3.1 0.7 - 3.1 x10E3/uL   Monocytes Absolute 0.4 0.1 - 0.9 x10E3/uL   EOS (ABSOLUTE) 0.2 0.0 - 0.4 x10E3/uL   Basophils Absolute 0.0 0.0 - 0.2 x10E3/uL   Immature Granulocytes 0 %   Immature Grans (Abs) 0.0 0.0 - 0.1 x10E3/uL      Assessment & Plan:   Problem List Items Addressed This Visit   None   Visit Diagnoses    Acute gastritis without hemorrhage, unspecified gastritis type    -  Primary   Will check labs, FOBT and stool h pylori. Referral to GI. Treat with carafate and protonix. Call with any concerns or if not getting better.    Relevant Orders   CBC with Differential/Platelet   Comprehensive metabolic panel   Fecal occult blood, imunochemical([facility])   Ambulatory referral to Gastroenterology   H. pylori antigen, stool       Follow up plan: Return if symptoms worsen or fail to improve.

## 2020-05-03 LAB — COMPREHENSIVE METABOLIC PANEL
ALT: 17 IU/L (ref 0–44)
AST: 22 IU/L (ref 0–40)
Albumin/Globulin Ratio: 1.6 (ref 1.2–2.2)
Albumin: 4.8 g/dL (ref 4.0–5.0)
Alkaline Phosphatase: 99 IU/L (ref 44–121)
BUN/Creatinine Ratio: 17 (ref 9–20)
BUN: 19 mg/dL (ref 6–24)
Bilirubin Total: 0.3 mg/dL (ref 0.0–1.2)
CO2: 27 mmol/L (ref 20–29)
Calcium: 10 mg/dL (ref 8.7–10.2)
Chloride: 98 mmol/L (ref 96–106)
Creatinine, Ser: 1.14 mg/dL (ref 0.76–1.27)
GFR calc Af Amer: 92 mL/min/{1.73_m2} (ref >59)
GFR calc non Af Amer: 79 mL/min/{1.73_m2} (ref >59)
Globulin, Total: 3 g/dL (ref 1.5–4.5)
Glucose: 86 mg/dL (ref 65–99)
Potassium: 4.6 mmol/L (ref 3.5–5.2)
Sodium: 140 mmol/L (ref 134–144)
Total Protein: 7.8 g/dL (ref 6.0–8.5)

## 2020-05-03 LAB — CBC WITH DIFFERENTIAL/PLATELET
Basophils Absolute: 0 10*3/uL (ref 0.0–0.2)
Basos: 0 %
EOS (ABSOLUTE): 0.3 10*3/uL (ref 0.0–0.4)
Eos: 4 %
Hematocrit: 48.3 % (ref 37.5–51.0)
Hemoglobin: 16.3 g/dL (ref 13.0–17.7)
Immature Grans (Abs): 0 10*3/uL (ref 0.0–0.1)
Immature Granulocytes: 1 %
Lymphocytes Absolute: 3 10*3/uL (ref 0.7–3.1)
Lymphs: 35 %
MCH: 29.4 pg (ref 26.6–33.0)
MCHC: 33.7 g/dL (ref 31.5–35.7)
MCV: 87 fL (ref 79–97)
Monocytes Absolute: 0.6 10*3/uL (ref 0.1–0.9)
Monocytes: 7 %
Neutrophils Absolute: 4.6 10*3/uL (ref 1.4–7.0)
Neutrophils: 53 %
Platelets: 292 10*3/uL (ref 150–450)
RBC: 5.55 x10E6/uL (ref 4.14–5.80)
RDW: 13.4 % (ref 11.6–15.4)
WBC: 8.6 10*3/uL (ref 3.4–10.8)

## 2020-05-08 ENCOUNTER — Other Ambulatory Visit: Payer: 59

## 2020-05-08 ENCOUNTER — Other Ambulatory Visit: Payer: Self-pay

## 2020-05-08 DIAGNOSIS — K2900 Acute gastritis without bleeding: Secondary | ICD-10-CM

## 2020-05-09 LAB — FECAL OCCULT BLOOD, IMMUNOCHEMICAL: Fecal Occult Bld: NEGATIVE

## 2020-05-10 LAB — H. PYLORI ANTIGEN, STOOL: H pylori Ag, Stl: NEGATIVE

## 2020-06-09 ENCOUNTER — Ambulatory Visit (INDEPENDENT_AMBULATORY_CARE_PROVIDER_SITE_OTHER): Payer: 59 | Admitting: Gastroenterology

## 2020-06-09 ENCOUNTER — Encounter: Payer: Self-pay | Admitting: Gastroenterology

## 2020-06-09 ENCOUNTER — Other Ambulatory Visit: Payer: Self-pay

## 2020-06-09 VITALS — BP 122/81 | HR 74 | Temp 97.4°F | Ht 69.0 in | Wt 160.0 lb

## 2020-06-09 DIAGNOSIS — R109 Unspecified abdominal pain: Secondary | ICD-10-CM

## 2020-06-09 DIAGNOSIS — K219 Gastro-esophageal reflux disease without esophagitis: Secondary | ICD-10-CM

## 2020-06-09 DIAGNOSIS — R197 Diarrhea, unspecified: Secondary | ICD-10-CM | POA: Diagnosis not present

## 2020-06-09 NOTE — Progress Notes (Signed)
[facility] [address]  [address]  [place], [place] [address]  Main: [phone]  Fax: [phone]   Gastroenterology Consultation  Referring Provider:     Dorcas Carrow, DO Primary Care Physician:  Dorcas Carrow, DO Reason for Consultation:    GERD        HPI:    Chief Complaint  Patient presents with  . Gastroesophageal Reflux    Mithcell House is a 40 y.o. y/o male referred for consultation & management  by Dr. Laural Benes, Megan P, DO.  Patient reports 15-year history of abdominal pain, dull, nonradiating, 5/10, burning sensation in stomach, intermittent, present with or without meals.  No nausea or vomiting or weight loss.  Also reports associated diarrhea with 3-5 loose bowel movements a day.  No blood in stool.  No prior EGD or colonoscopy.  Has been on PPI for a while.  Was on Protonix, now on omeprazole.  With this, symptoms did improve but he continues to have daily abdominal pain symptoms despite this.  Also has started taking probiotics.  No dysphagia.  Past Medical History:  Diagnosis Date  . Genital warts   . GERD (gastroesophageal reflux disease)     Past Surgical History:  Procedure Laterality Date  . TONSILLECTOMY      Prior to Admission medications   Medication Sig Start Date End Date Taking? Authorizing Provider  omeprazole (PRILOSEC) 20 MG capsule TAKE 1 TO 2 CAPSULES BY MOUTH EVERY DAY 02/11/17  Yes Johnson, Megan P, DO  pantoprazole (PROTONIX) 40 MG tablet Take 1 tablet (40 mg total) by mouth daily. 05/02/20  Yes Johnson, Megan P, DO  sucralfate (CARAFATE) 1 g tablet Take 1 tablet (1 g total) by mouth 4 (four) times daily -  with meals and at bedtime. 05/02/20  Yes Johnson, Megan P, DO    Family History  Problem Relation Age of Onset  . Arthritis Mother   . Hypertension Father   . Diabetes Maternal Grandmother   . Arthritis Maternal Grandmother   . Heart disease Maternal Grandmother   . Hypertension Paternal Grandmother   .  Heart disease Paternal Grandfather      Social History   Tobacco Use  . Smoking status: Former Smoker    Quit date: 04/27/2011    Years since quitting: 9.1  . Smokeless tobacco: Never Used  Vaping Use  . Vaping Use: Never used  Substance Use Topics  . Alcohol use: No  . Drug use: No    Allergies as of 06/09/2020  . (No Known Allergies)    Review of Systems:    All systems reviewed and negative except where noted in HPI.   Physical Exam:  BP 122/81   Pulse 74   Temp (!) 97.4 F (36.3 C) (Oral)   Ht 5\' 9"  (1.753 m)   Wt 160 lb (72.6 kg)   BMI 23.63 kg/m  No LMP for male patient. Psych:  Alert and cooperative. Normal mood and affect. General:   Alert,  Well-developed, well-nourished, pleasant and cooperative in NAD Head:  Normocephalic and atraumatic. Eyes:  Sclera clear, no icterus.   Conjunctiva pink. Ears:  Normal auditory acuity. Nose:  No deformity, discharge, or lesions. Mouth:  No deformity or lesions,oropharynx pink & moist. Neck:  Supple; no masses or thyromegaly. Abdomen:  Normal bowel sounds.  No bruits.  Soft, non-tender and non-distended without masses, hepatosplenomegaly or hernias noted.  No guarding or rebound tenderness.    Msk:  Symmetrical without  gross deformities. Good, equal movement & strength bilaterally. Pulses:  Normal pulses noted. Extremities:  No clubbing or edema.  No cyanosis. Neurologic:  Alert and oriented x3;  grossly normal neurologically. Skin:  Intact without significant lesions or rashes. No jaundice. Lymph Nodes:  No significant cervical adenopathy. Psych:  Alert and cooperative. Normal mood and affect.   Labs: CBC    Component Value Date/Time   WBC 8.6 05/02/2020 1506   RBC 5.55 05/02/2020 1506   HGB 16.3 05/02/2020 1506   HCT 48.3 05/02/2020 1506   PLT 292 05/02/2020 1506   MCV 87 05/02/2020 1506   MCH 29.4 05/02/2020 1506   MCHC 33.7 05/02/2020 1506   RDW 13.4 05/02/2020 1506   LYMPHSABS 3.0 05/02/2020 1506    EOSABS 0.3 05/02/2020 1506   BASOSABS 0.0 05/02/2020 1506   CMP     Component Value Date/Time   NA 140 05/02/2020 1506   K 4.6 05/02/2020 1506   CL 98 05/02/2020 1506   CO2 27 05/02/2020 1506   GLUCOSE 86 05/02/2020 1506   BUN 19 05/02/2020 1506   CREATININE 1.14 05/02/2020 1506   CALCIUM 10.0 05/02/2020 1506   PROT 7.8 05/02/2020 1506   ALBUMIN 4.8 05/02/2020 1506   AST 22 05/02/2020 1506   ALT 17 05/02/2020 1506   ALKPHOS 99 05/02/2020 1506   BILITOT 0.3 05/02/2020 1506   GFRNONAA 79 05/02/2020 1506   GFRAA 92 05/02/2020 1506    Imaging Studies: No results found.  Assessment and Plan:   Jayvin Hurrell is a 40 y.o. y/o male has been referred for GERD  Patient is reporting burning sensation in stomach despite PPI for a long time.  H. pylori stool antigen was recently negative  Due to ongoing symptoms, patient would like further evaluation with endoscopy  Alternative options of conservative management were discussed in detail, including but not limited to medication management, foregoing endoscopic procedures at this time and others.    EGD for abdominal pain, from possible GERD, despite PPI therapy and lifestyle changes  Colonoscopy for abdominal pain and diarrhea  I have discussed alternative options, risks & benefits,  which include, but are not limited to, bleeding, infection, perforation,respiratory complication & drug reaction.  The patient agrees with this plan & written consent will be obtained.     Dr Jordan House  Speech recognition software was used to dictate the above note.

## 2020-10-07 ENCOUNTER — Other Ambulatory Visit: Payer: Self-pay | Admitting: Family Medicine

## 2021-02-20 ENCOUNTER — Other Ambulatory Visit: Payer: Self-pay | Admitting: Family Medicine

## 2021-02-20 NOTE — Telephone Encounter (Signed)
Requested medication (s) are due for refill today  Yes  Requested medication (s) are on the active medication list Yes  Future visit scheduled No.   Note to clinic-Chart note on 06/09/20 with GI reflects to the patient is no longer taking this medication. LOV 05/02/20.  Routing to physician for review.    Requested Prescriptions  Pending Prescriptions Disp Refills   pantoprazole (PROTONIX) 40 MG tablet [Pharmacy Med Name: PANTOPRAZOLE SOD DR 40 MG TAB] 30 tablet 0    Sig: Take 1 tablet (40 mg total) by mouth daily.     Gastroenterology: Proton Pump Inhibitors Passed - 02/20/2021  9:04 AM      Passed - Valid encounter within last 12 months    Recent Outpatient Visits           9 months ago Acute gastritis without hemorrhage, unspecified gastritis type   [facility] [place], Megan P, DO   1 year ago Vertigo   [facility] [place], [place], DO   3 years ago Right arm weakness   [facility] [place], Megan P, DO   4 years ago Gastroesophageal reflux disease, esophagitis presence not specified   [facility] [place], Megan P, DO   5 years ago Routine general medical examination at a health care facility   [facility], [place], DO

## 2021-04-03 ENCOUNTER — Other Ambulatory Visit: Payer: Self-pay | Admitting: Family Medicine

## 2021-04-03 NOTE — Telephone Encounter (Signed)
Pt will need appt for further refills. Courtesy refill given.   Requested Prescriptions  Pending Prescriptions Disp Refills  . pantoprazole (PROTONIX) 40 MG tablet [Pharmacy Med Name: PANTOPRAZOLE SOD DR 40 MG TAB] 30 tablet 0    Sig: Take 1 tablet (40 mg total) by mouth daily.     Gastroenterology: Proton Pump Inhibitors Passed - 04/03/2021  1:07 PM      Passed - Valid encounter within last 12 months    Recent Outpatient Visits          11 months ago Acute gastritis without hemorrhage, unspecified gastritis type   [facility] [place], Megan P, DO   1 year ago Vertigo   [facility] [place], [place], DO   3 years ago Right arm weakness   [facility] [place], Megan P, DO   4 years ago Gastroesophageal reflux disease, esophagitis presence not specified   [facility] [place], Megan P, DO   6 years ago Routine general medical examination at a health care facility   [facility], [place], DO

## 2021-05-04 ENCOUNTER — Other Ambulatory Visit: Payer: Self-pay | Admitting: Family Medicine

## 2021-05-04 NOTE — Telephone Encounter (Signed)
Patient requesting early refill due to sometimes taking more than one tab daily with increased reflux episodes he stated . Protonix 40 mg last ordered 04/03/21 #30 tabs was a courtesy refill.  Appointment scheduled for 06/10/21 (patient's request as he is leaving for [facility] for 4 weeks) Refill request sent to [facility] for review.

## 2021-05-04 NOTE — Telephone Encounter (Signed)
/  Requested medications are due for refill today.  yes  Requested medications are on the active medications list.  yes  Last refill. 04/03/2021  Future visit scheduled.   no  Notes to clinic.  Courtesy refill already given. Called pt and LMOM to return call for Appt.     Requested Prescriptions  Pending Prescriptions Disp Refills   pantoprazole (PROTONIX) 40 MG tablet [Pharmacy Med Name: PANTOPRAZOLE SOD DR 40 MG TAB] 30 tablet 0    Sig: Take 1 tablet (40 mg total) by mouth daily.     Gastroenterology: Proton Pump Inhibitors Failed - 05/04/2021  9:07 AM      Failed - Valid encounter within last 12 months    Recent Outpatient Visits           1 year ago Acute gastritis without hemorrhage, unspecified gastritis type   [facility] [place], Megan P, DO   1 year ago Vertigo   [facility] [place], [place], DO   3 years ago Right arm weakness   [facility] [place], Megan P, DO   4 years ago Gastroesophageal reflux disease, esophagitis presence not specified   [facility] [place], Megan P, DO   6 years ago Routine general medical examination at a health care facility   [facility], [place], DO

## 2021-05-29 ENCOUNTER — Telehealth: Payer: Self-pay | Admitting: Family Medicine

## 2021-05-29 NOTE — Telephone Encounter (Signed)
Pt stated pantoprazole (PROTONIX) 40 MG tablet has stopped working pt unsure if is doing anything. Pt wanted to let PCP know he is now taking as of yesterday sucralfate (CARAFATE) 1g tablet feels it has helped a little.  Pt stated his stomach has been very irritated.  Pt mentioned he has appointment with Gastroenterology on 06/08/2021.

## 2021-05-29 NOTE — Telephone Encounter (Signed)
OK to keep his appt with GI

## 2021-05-29 NOTE — Telephone Encounter (Signed)
FYI to provider

## 2021-06-02 ENCOUNTER — Ambulatory Visit: Payer: Self-pay | Admitting: *Deleted

## 2021-06-02 NOTE — Telephone Encounter (Signed)
I have not seen patient in over a year and he does not have a muscle relaxer on his medication list. He would need to be seen to get that. We can do a virtual if that is easier for him

## 2021-06-02 NOTE — Telephone Encounter (Signed)
Message from [employer] sent at 06/02/2021  9:34 AM EST  Summary: lower back discomfort / rx request   The patient strained their lower back yesterday at work 06/01/21   The patient was using a pressure washer and believes they strained it while working   The patient would like to be prescribed something to help with their discomfort   Please contact further when available          Reason for Disposition  [1] SEVERE pain (e.g., excruciating) AND [2] not improved 2 hours after pain medicine/ice packs  Answer Assessment - Initial Assessment Questions 1. MECHANISM: "How did the injury happen?" (Consider the possibility of domestic violence or elder abuse)     Lower back pain- strained at work 2. ONSET: "When did the injury happen?" (Minutes or hours ago)     yesterday 3. LOCATION: "What part of the back is injured?"     Lower back 4. SEVERITY: "Can you move the back normally?"     Hurts moving 5. PAIN: "Is there any pain?" If Yes, ask: "How bad is the pain?"   (Scale 1-10; or mild, moderate, severe)     6-8 6. CORD SYMPTOMS: Any weakness or numbness of the arms or legs?"     no 7. SIZE: For cuts, bruises, or swelling, ask: "How large is it?" (e.g., inches or centimeters)     no 8. TETANUS: For any breaks in the skin, ask: "When was the last tetanus booster?"     *No Answer* 9. OTHER SYMPTOMS: "Do you have any other symptoms?" (e.g., abdominal pain, blood in urine)     no 10. PREGNANCY: "Is there any chance you are pregnant?" "When was your last menstrual period?"       *No Answer*  Protocols used: Back Injury-A-AH

## 2021-06-02 NOTE — Telephone Encounter (Signed)
°  Chief Complaint: strained lower back at work Symptoms: lower back pain Frequency: started yesterday Pertinent Negatives: Patient denies  abdominal pain, blood in urine Disposition: [] ED /[] Urgent Care (no appt availability in office) / [] Appointment(In office/virtual)/ []  [facility] [facility]/ [] Home Care/ [x] Refused Recommended Disposition /[] [facility]/ []  Follow-up with PCP Additional Notes: Patient called to request muscle relaxer- he declines appointment because he has upcoming procedure and upcoming appointment with PCP and it is expensive for him to come to office.  Patient advised would send request to PCP

## 2021-06-02 NOTE — Telephone Encounter (Signed)
Pt states he will continue to take aleve and if it doesn't get any better than he will call to make appt.

## 2021-06-03 ENCOUNTER — Ambulatory Visit: Payer: Self-pay

## 2021-06-03 ENCOUNTER — Telehealth (INDEPENDENT_AMBULATORY_CARE_PROVIDER_SITE_OTHER): Payer: Self-pay | Admitting: Family Medicine

## 2021-06-03 ENCOUNTER — Encounter: Payer: Self-pay | Admitting: Family Medicine

## 2021-06-03 DIAGNOSIS — M545 Low back pain, unspecified: Secondary | ICD-10-CM

## 2021-06-03 MED ORDER — CYCLOBENZAPRINE HCL 10 MG PO TABS: 10.0000 mg | ORAL_TABLET | Freq: Every day | ORAL | 0 refills | Status: DC

## 2021-06-03 MED ORDER — NAPROXEN 500 MG PO TABS: 500.0000 mg | ORAL_TABLET | Freq: Two times a day (BID) | ORAL | 2 refills | Status: DC

## 2021-06-03 NOTE — Telephone Encounter (Signed)
See other encounter.

## 2021-06-03 NOTE — Progress Notes (Signed)
There were no vitals taken for this visit.   Subjective:    Patient ID: Jordan House, male    DOB: [birth year], 41 y.o.   MRN: [id number]  HPI: Jordan House is a 41 y.o. 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 States he has been using icy hot for the pain. States he feels very sore and stiff. Hurts more with movement.    BACK PAIN Duration: 2-3 days Mechanism of injury:  pressure washing Location: midline low back Onset: sudden Severity: moderate Quality: aching and sore Frequency: constant Radiation: none Aggravating factors: icy hot and aleve Alleviating factors: icy hot Status: worse Treatments attempted: rest, ice, heat, APAP, ibuprofen, and aleve  Relief with NSAIDs?: mild Nighttime pain:  yes Paresthesias / decreased sensation:  no Bowel / bladder incontinence:  no Fevers:  no Dysuria / urinary frequency:  no   Relevant past medical, surgical, family and social history reviewed and updated as indicated. Interim medical history since our last visit reviewed. Allergies and medications reviewed and updated.  Review of Systems  Constitutional: Negative.   Respiratory: Negative.    Cardiovascular: Negative.   Gastrointestinal: Negative.   Musculoskeletal:  Positive for back pain and myalgias. Negative for arthralgias, gait problem, joint swelling, neck pain and neck stiffness.  Neurological: Negative.   Psychiatric/Behavioral: Negative.     Per HPI unless specifically indicated above     Objective:    There were no vitals taken for this visit.  Wt Readings from Last 3 Encounters:  06/09/20 160 lb (72.6 kg)  05/02/20 162 lb (73.5 kg)  01/08/20 159 lb (72.1 kg)    Physical Exam Vitals and nursing note reviewed.  Constitutional:      General: He is not in acute distress.    Appearance: Normal appearance. He is not ill-appearing, toxic-appearing or  diaphoretic.  HENT:     Head: Normocephalic and atraumatic.     Right Ear: External ear normal.     Left Ear: External ear normal.     Nose: Nose normal.     Mouth/Throat:     Mouth: Mucous membranes are moist.     Pharynx: Oropharynx is clear.  Eyes:     General: No scleral icterus.       Right eye: No discharge.        Left eye: No discharge.     Conjunctiva/sclera: Conjunctivae normal.     Pupils: Pupils are equal, round, and reactive to light.  Pulmonary:     Effort: Pulmonary effort is normal. No respiratory distress.     Comments: Speaking in full sentences Musculoskeletal:        General: Normal range of motion.     Cervical back: Normal range of motion.  Skin:    Coloration: Skin is not jaundiced or pale.     Findings: No bruising, erythema, lesion or rash.  Neurological:     Mental Status: He is alert and oriented to person, place, and time. Mental status is at baseline.  Psychiatric:        Mood and Affect: Mood normal.        Behavior: Behavior normal.        Thought Content: Thought content normal.        Judgment: Judgment normal.    Results for orders placed or performed in visit on 05/08/20  Fecal occult blood,  imunochemical([facility])   Specimen: Stool   ST  Result Value Ref Range   Fecal Occult Bld Negative Negative  H. pylori antigen, stool  Result Value Ref Range   H pylori Ag, Stl Negative Negative      Assessment & Plan:   Problem List Items Addressed This Visit   None Visit Diagnoses     Acute bilateral low back pain without sciatica    -  Primary   Will treat with naproxen, flexeril and stretches. Call if not getting better or getting worse. Continue to monitor.    Relevant Medications   cyclobenzaprine (FLEXERIL) 10 MG tablet   naproxen (NAPROSYN) 500 MG tablet        Follow up plan: Return if symptoms worsen or fail to improve.   This visit was completed via video visit through MyChart due to the restrictions of the  COVID-19 pandemic. All issues as above were discussed and addressed. Physical exam was done as above through visual confirmation on video through MyChart. If it was felt that the patient should be evaluated in the office, they were directed there. The patient verbally consented to this visit. Location of the patient: home Location of the provider: work Those involved with this call:  Provider: Park Liter, DO CMA: Yvonna Alanis, [place] Desk/Registration: [employer]  Time spent on call:  15 minutes with patient face to face via video conference. More than 50% of this time was spent in counseling and coordination of care. 23 minutes total spent in review of patient's record and preparation of their chart.

## 2021-06-03 NOTE — Telephone Encounter (Signed)
Patient called in to inform dr Laural Benes that he is willing to do the virtual visit today in order to get the muscle relaxer's for his  back pain. Patient had told the nurse that he would wait and see if the pain subsides but it did not. Please advise and call  Ph# [phone]   Pt. Asking to be worked in today for virtual visit. Please advise pt.

## 2021-06-03 NOTE — Telephone Encounter (Signed)
Appt scheduled and pt is aware

## 2021-06-03 NOTE — Telephone Encounter (Signed)
Pt. Asking to be worked in today for virtual visit. Please advise. Thanks.

## 2021-06-03 NOTE — Telephone Encounter (Signed)
OK to put at [facility] as a virtual

## 2021-06-08 ENCOUNTER — Other Ambulatory Visit: Payer: Self-pay

## 2021-06-08 ENCOUNTER — Encounter: Payer: Self-pay | Admitting: Gastroenterology

## 2021-06-08 ENCOUNTER — Ambulatory Visit (INDEPENDENT_AMBULATORY_CARE_PROVIDER_SITE_OTHER): Payer: 59 | Admitting: Gastroenterology

## 2021-06-08 VITALS — BP 130/83 | HR 102 | Temp 98.2°F | Ht 69.0 in | Wt 174.0 lb

## 2021-06-08 DIAGNOSIS — K580 Irritable bowel syndrome with diarrhea: Secondary | ICD-10-CM

## 2021-06-08 DIAGNOSIS — K219 Gastro-esophageal reflux disease without esophagitis: Secondary | ICD-10-CM | POA: Diagnosis not present

## 2021-06-08 MED ORDER — AMITRIPTYLINE HCL 10 MG PO TABS: 10.0000 mg | ORAL_TABLET | Freq: Every day | ORAL | 1 refills | Status: DC

## 2021-06-08 NOTE — Progress Notes (Signed)
Jordan Repress, MD [address]  [address]  [place], [place] [address]  Main: [phone]  Fax: [phone]    Gastroenterology Consultation  Referring Provider:     Dorcas Carrow, DO Primary Care Physician:  Dorcas Carrow, DO Primary Gastroenterologist:  Dr. Arlyss House Reason for Consultation:     Chronic abdominal pain, nonbloody diarrhea        HPI:   Khylil Degner is a 41 y.o. male referred by Dr. Laural Benes, Oralia Rud, DO  for consultation & management of longstanding symptoms of central abdominal pain associated with loose stools, intermittent abdominal bloating.  Patient was treated with long-term PPI for reflux symptoms although he denies heartburn, regurgitation.  He was tested negative for H. pylori based on H. pylori stool antigen.  He reports that his abdominal pain is central.  He denies any rectal bleeding.  His CBC, CMP, TSH were normal.  Fecal occult blood negative.  Patient reports that he has anxiety, he wants a business.  His anxiety is not under control.  Patient switched from omeprazole because he was taking it for several years to Protonix 40 mg daily.  Recently had a flareup of GI symptoms, started sucralfate 1 g 4 times daily with some relief. Patient was recommended to undergo a upper endoscopy and colonoscopy, he was anxious about the procedures and did not follow-up.  Patient denies alcohol use  NSAIDs: None  Antiplts/Anticoagulants/Anti thrombotics: None  GI Procedures: None No known family history of celiac disease, GI malignancy, IBD  Past Medical History:  Diagnosis Date   Genital warts    GERD (gastroesophageal reflux disease)     Past Surgical History:  Procedure Laterality Date   TONSILLECTOMY     Current Outpatient Medications:    amitriptyline (ELAVIL) 10 MG tablet, Take 1 tablet (10 mg total) by mouth at bedtime., Disp: 30 tablet, Rfl: 1   cyclobenzaprine (FLEXERIL) 10 MG tablet, Take 1 tablet (10 mg total) by mouth at  bedtime., Disp: 30 tablet, Rfl: 0   naproxen (NAPROSYN) 500 MG tablet, Take 1 tablet (500 mg total) by mouth 2 (two) times daily with a meal., Disp: 60 tablet, Rfl: 2   pantoprazole (PROTONIX) 40 MG tablet, Take 1 tablet (40 mg total) by mouth daily., Disp: 30 tablet, Rfl: 0   sucralfate (CARAFATE) 1 g tablet, Take 1 tablet (1 g total) by mouth 4 (four) times daily -  with meals and at bedtime., Disp: 120 tablet, Rfl: 3   Family History  Problem Relation Age of Onset   Arthritis Mother    Hypertension Father    Diabetes Maternal Grandmother    Arthritis Maternal Grandmother    Heart disease Maternal Grandmother    Hypertension Paternal Grandmother    Heart disease Paternal Grandfather      Social History   Tobacco Use   Smoking status: Former    Types: Cigarettes    Quit date: 04/27/2011    Years since quitting: 10.1   Smokeless tobacco: Never  Vaping Use   Vaping Use: Never used  Substance Use Topics   Alcohol use: No   Drug use: No    Allergies as of 06/08/2021   (No Known Allergies)    Review of Systems:    All systems reviewed and negative except where noted in HPI.   Physical Exam:  BP 130/83 (BP Location: Right Arm, Patient Position: Sitting, Cuff Size: Normal)    Pulse (!) 102    Temp  98.2 F (36.8 C) (Oral)    Ht 5\' 9"  (1.753 m)    Wt 174 lb (78.9 kg)    BMI 25.70 kg/m  No LMP for male patient.  General:   Alert,  Well-developed, well-nourished, pleasant and cooperative in NAD Head:  Normocephalic and atraumatic. Eyes:  Sclera clear, no icterus.   Conjunctiva pink. Ears:  Normal auditory acuity. Nose:  No deformity, discharge, or lesions. Mouth:  No deformity or lesions,oropharynx pink & moist. Neck:  Supple; no masses or thyromegaly. Lungs:  Respirations even and unlabored.  Clear throughout to auscultation.   No wheezes, crackles, or rhonchi. No acute distress. Heart:  Regular rate and rhythm; no murmurs, clicks, rubs, or gallops. Abdomen:  Normal bowel  sounds. Soft, non-tender and non-distended without masses, hepatosplenomegaly or hernias noted.  No guarding or rebound tenderness.   Rectal: Not performed Msk:  Symmetrical without gross deformities. Good, equal movement & strength bilaterally. Pulses:  Normal pulses noted. Extremities:  No clubbing or edema.  No cyanosis. Neurologic:  Alert and oriented x3;  grossly normal neurologically. Skin:  Intact without significant lesions or rashes. No jaundice. Psych:  Alert and cooperative. Normal mood and affect.  Imaging Studies: None  Assessment and Plan:   Gannicus Luers is a 41 y.o. male with history of generalized anxiety disorder is seen in consultation for chronic symptoms of abdominal pain associated with nonbloody diarrhea with no other constitutional symptoms.  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  TSH normal, CBC, CMP, H. pylori stool antigen negative  Check celiac disease panel Trial of amitriptyline 10 mg at bedtime, increase to 20 mg within a week if able to tolerate without any side effects I have also offered upper endoscopy and colonoscopy given his age, patient would like to try medication first   Follow up in 4 to 6 weeks, virtual visit   Cephas Darby, MD

## 2021-06-10 ENCOUNTER — Other Ambulatory Visit: Payer: Self-pay | Admitting: Family Medicine

## 2021-06-10 ENCOUNTER — Ambulatory Visit: Payer: Self-pay | Admitting: Family Medicine

## 2021-06-10 LAB — CELIAC DISEASE PANEL
Endomysial IgA: NEGATIVE
IgA/Immunoglobulin A, Serum: 201 mg/dL (ref 90–386)
Transglutaminase IgA: <2 U/mL (ref 0–3)

## 2021-06-10 NOTE — Telephone Encounter (Signed)
Requested Prescriptions  Pending Prescriptions Disp Refills   pantoprazole (PROTONIX) 40 MG tablet [Pharmacy Med Name: PANTOPRAZOLE SOD DR 40 MG TAB] 90 tablet 1    Sig: Take 1 tablet (40 mg total) by mouth daily.     Gastroenterology: Proton Pump Inhibitors Passed - 06/10/2021  8:58 AM      Passed - Valid encounter within last 12 months    Recent Outpatient Visits          1 week ago Acute bilateral low back pain without sciatica   [facility] [place], Megan P, DO   1 year ago Acute gastritis without hemorrhage, unspecified gastritis type   [facility] [place], Megan P, DO   1 year ago Vertigo   [facility] [place], [place], DO   3 years ago Right arm weakness   [facility] [place], Megan P, DO   4 years ago Gastroesophageal reflux disease, esophagitis presence not specified   [facility] [place], [place], DO

## 2021-06-15 ENCOUNTER — Ambulatory Visit: Payer: Self-pay | Admitting: Family Medicine

## 2021-11-09 ENCOUNTER — Telehealth: Payer: Self-pay

## 2021-11-09 NOTE — Telephone Encounter (Signed)
Patient cannot be prescribed an antibiotic without a visit. Video visit can be done or he can come in for an in office appointment. Please call to schedule.

## 2021-11-09 NOTE — Telephone Encounter (Signed)
Called patient to schedule an appointment. Call dropped. I tried to call the patient back but it went straight to voicemail.

## 2021-11-09 NOTE — Telephone Encounter (Signed)
Patient called and stated that he is having some burning and itching in his eyes. Worst in the right eye, started this morning. Pt states that his daughter has pink eye. Pt would like an antibiotic. Pt declined mychart visit and nurse triage. He states he only wants the medication for treatment. Please advise pt.

## 2021-11-09 NOTE — Telephone Encounter (Signed)
Pt called back and declined to schedule an appointment, stating he did not want to pay $50.00.  Pt stated, I guess I'll let this run its course.   Please advise.

## 2021-12-22 ENCOUNTER — Other Ambulatory Visit: Payer: Self-pay | Admitting: Family Medicine

## 2021-12-22 NOTE — Telephone Encounter (Signed)
Requested Prescriptions  Pending Prescriptions Disp Refills  . pantoprazole (PROTONIX) 40 MG tablet [Pharmacy Med Name: PANTOPRAZOLE SOD DR 40 MG TAB] 90 tablet 1    Sig: Take 1 tablet (40 mg total) by mouth daily.     Gastroenterology: Proton Pump Inhibitors Passed - 12/22/2021 11:43 AM      Passed - Valid encounter within last 12 months    Recent Outpatient Visits          6 months ago Acute bilateral low back pain without sciatica   [facility] [place], Megan P, DO   1 year ago Acute gastritis without hemorrhage, unspecified gastritis type   [facility] [place], Megan P, DO   1 year ago Vertigo   [facility] [place], [place], DO   4 years ago Right arm weakness   [facility] [place], Megan P, DO   4 years ago Gastroesophageal reflux disease, esophagitis presence not specified   [facility] [place], [place], DO

## 2022-04-16 ENCOUNTER — Ambulatory Visit: Payer: Self-pay | Admitting: *Deleted

## 2022-04-16 NOTE — Telephone Encounter (Signed)
    Chief Complaint: Cough Symptoms: Above Frequency: 2 weeks ago Pertinent Negatives: Patient denies fever Disposition: [] ED /[] Urgent Care (no appt availability in office) / [] Appointment(In office/virtual)/ [x]  [facility]/ [] Home Care/ [] Refused Recommended Disposition /[] [facility]/ []  Follow-up with PCP Additional Notes:   Reason for Disposition  [1] MILD difficulty breathing (e.g., minimal/no SOB at rest, SOB with walking, pulse <100) AND [2] still present when not coughing  Answer Assessment - Initial Assessment Questions 1. ONSET: "When did the cough begin?"      2 weeks ago 2. SEVERITY: "How bad is the cough today?"      Severe 3. SPUTUM: "Describe the color of your sputum" (none, dry cough; clear, white, yellow, green)     Yellow 4. HEMOPTYSIS: "Are you coughing up any blood?" If so ask: "How much?" (flecks, streaks, tablespoons, etc.)     No 5. DIFFICULTY BREATHING: "Are you having difficulty breathing?" If Yes, ask: "How bad is it?" (e.g., mild, moderate, severe)    - MILD: No SOB at rest, mild SOB with walking, speaks normally in sentences, can lie down, no retractions, pulse < 100.    - MODERATE: SOB at rest, SOB with minimal exertion and prefers to sit, cannot lie down flat, speaks in phrases, mild retractions, audible wheezing, pulse 100-120.    - SEVERE: Very SOB at rest, speaks in single words, struggling to breathe, sitting hunched forward, retractions, pulse > 120      Mild 6. FEVER: "Do you have a fever?" If Yes, ask: "What is your temperature, how was it measured, and when did it start?"     No 7. CARDIAC HISTORY: "Do you have any history of heart disease?" (e.g., heart attack, congestive heart failure)      No 8. LUNG HISTORY: "Do you have any history of lung disease?"  (e.g., pulmonary embolus, asthma, emphysema)     No 9. PE RISK FACTORS: "Do you have a history of blood clots?" (or: recent major surgery, recent prolonged travel,  bedridden)     No 10. OTHER SYMPTOMS: "Do you have any other symptoms?" (e.g., runny nose, wheezing, chest pain)       Runny nose 11. PREGNANCY: "Is there any chance you are pregnant?" "When was your last menstrual period?"       N/a 12. TRAVEL: "Have you traveled out of the country in the last month?" (e.g., travel history, exposures)       No  Protocols used: Cough - Acute Productive-A-AH

## 2022-04-16 NOTE — Telephone Encounter (Signed)
2nd attempt, Patient called, left VM to return the call to the office to discuss symptoms with a nurse.  Summary: Cough body aches and congesiton for 14 days needed advice   Pt is calling with cough with body aches, congestion for 14 days. Has done all home remedies with relief. No appts today at [facility]

## 2022-04-16 NOTE — Telephone Encounter (Signed)
Third attempt to contact patient regarding symptoms. No answer- left message to call office

## 2022-04-16 NOTE — Telephone Encounter (Signed)
Message from Valora Piccolo sent at 04/16/2022  7:57 AM EST  Summary: Cough body aches and congesiton for 14 days needed advice   Pt is calling with cough with body aches, congestion for 14 days. Has done all home remedies with relief. No appts today at [facility]         Attempted to call patient- left message to call office

## 2022-07-21 NOTE — Progress Notes (Deleted)
   There were no vitals taken for this visit.   Subjective:    Patient ID: Tyberious Tweten, male    DOB: [birth year], 42 y.o.   MRN: [id number]  HPI: Jahir Kossman is a 42 y.o. male  No chief complaint on file.  UPPER RESPIRATORY TRACT INFECTION Worst symptom: Fever: {Blank single:19197::"yes","no"} Cough: {Blank single:19197::"yes","no"} Shortness of breath: {Blank single:19197::"yes","no"} Wheezing: {Blank single:19197::"yes","no"} Chest pain: {Blank single:19197::"yes","no","yes, with cough"} Chest tightness: {Blank single:19197::"yes","no"} Chest congestion: {Blank single:19197::"yes","no"} Nasal congestion: {Blank single:19197::"yes","no"} Runny nose: {Blank single:19197::"yes","no"} Post nasal drip: {Blank single:19197::"yes","no"} Sneezing: {Blank single:19197::"yes","no"} Sore throat: {Blank single:19197::"yes","no"} Swollen glands: {Blank single:19197::"yes","no"} Sinus pressure: {Blank single:19197::"yes","no"} Headache: {Blank single:19197::"yes","no"} Face pain: {Blank single:19197::"yes","no"} Toothache: {Blank single:19197::"yes","no"} Ear pain: {Blank single:19197::"yes","no"} {Blank single:19197::""right","left", "bilateral"} Ear pressure: {Blank single:19197::"yes","no"} {Blank single:19197::""right","left", "bilateral"} Eyes red/itching:{Blank single:19197::"yes","no"} Eye drainage/crusting: {Blank single:19197::"yes","no"}  Vomiting: {Blank single:19197::"yes","no"} Rash: {Blank single:19197::"yes","no"} Fatigue: {Blank single:19197::"yes","no"} Sick contacts: {Blank single:19197::"yes","no"} Strep contacts: {Blank single:19197::"yes","no"}  Context: {Blank multiple:19196::"better","worse","stable","fluctuating"} Recurrent sinusitis: {Blank single:19197::"yes","no"} Relief with OTC cold/cough medications: {Blank single:19197::"yes","no"}  Treatments attempted: {Blank  multiple:19196::"none","cold/sinus","mucinex","anti-histamine","pseudoephedrine","cough syrup","antibiotics"}   Relevant past medical, surgical, family and social history reviewed and updated as indicated. Interim medical history since our last visit reviewed. Allergies and medications reviewed and updated.  Review of Systems  Per HPI unless specifically indicated above     Objective:    There were no vitals taken for this visit.  Wt Readings from Last 3 Encounters:  06/08/21 174 lb (78.9 kg)  06/09/20 160 lb (72.6 kg)  05/02/20 162 lb (73.5 kg)    Physical Exam  Results for orders placed or performed in visit on 06/08/21  Celiac Disease Panel  Result Value Ref Range   Endomysial IgA Negative Negative   Transglutaminase IgA <2 0 - 3 U/mL   IgA/Immunoglobulin A, Serum 201 90 - 386 mg/dL      Assessment & Plan:   Problem List Items Addressed This Visit   None    Follow up plan: No follow-ups on file.

## 2022-07-22 ENCOUNTER — Ambulatory Visit: Payer: Self-pay | Admitting: Nurse Practitioner

## 2022-11-15 ENCOUNTER — Other Ambulatory Visit: Payer: Self-pay | Admitting: Family Medicine

## 2022-11-16 NOTE — Telephone Encounter (Signed)
Requested Prescriptions  Pending Prescriptions Disp Refills   pantoprazole (PROTONIX) 40 MG tablet [Pharmacy Med Name: PANTOPRAZOLE SOD DR 40 MG TAB] 30 tablet 0    Sig: Take 1 tablet (40 mg total) by mouth daily.     Gastroenterology: Proton Pump Inhibitors Failed - 11/15/2022 10:40 AM      Failed - Valid encounter within last 12 months    Recent Outpatient Visits           1 year ago Acute bilateral low back pain without sciatica   [place] [facility] [place], [place], DO   2 years ago Acute gastritis without hemorrhage, unspecified gastritis type   [place] [place], [place], DO   2 years ago Vertigo   [place] [facility] [place], [place], DO   5 years ago Right arm weakness   [place] [facility] [place], Megan P, DO   5 years ago Gastroesophageal reflux disease, esophagitis presence not specified    [facility] [place], [place], DO

## 2023-01-11 ENCOUNTER — Other Ambulatory Visit: Payer: Self-pay | Admitting: Family Medicine

## 2023-01-12 ENCOUNTER — Telehealth: Payer: Self-pay | Admitting: Family Medicine

## 2023-01-12 NOTE — Telephone Encounter (Signed)
Medication Refill - Medication: pantoprazole (PROTONIX) 40 MG tablet [[id number]]   Has the patient contacted their pharmacy? Yes.     (Agent: If yes, when and what did the pharmacy advise?) Contact PCP   Preferred Pharmacy (with phone number or street name): [facility] [place], [place] - [address]   Has the patient been seen for an appointment in the last year OR does the patient have an upcoming appointment? No.  Agent: Please be advised that RX refills may take up to 3 business days. We ask that you follow-up with your pharmacy.   Pt says he is completely out of medication and would like a call back if he needs an appointment

## 2023-01-12 NOTE — Telephone Encounter (Signed)
Requested medication (s) are due for refill today: yes  Requested medication (s) are on the active medication list: yes  Last refill:  11/16/22 30 day courtesy refill  Future visit scheduled:no  Notes to clinic:  notified pt via MyChart to call office to make appt.   Requested Prescriptions  Pending Prescriptions Disp Refills   pantoprazole (PROTONIX) 40 MG tablet [Pharmacy Med Name: PANTOPRAZOLE SOD DR 40 MG TAB] 30 tablet 0    Sig: Take 1 tablet (40 mg total) by mouth daily.     Gastroenterology: Proton Pump Inhibitors Failed - 01/11/2023 10:23 AM      Failed - Valid encounter within last 12 months    Recent Outpatient Visits           1 year ago Acute bilateral low back pain without sciatica   [facility] [place], [place], DO   2 years ago Acute gastritis without hemorrhage, unspecified gastritis type   [place] [facility] [place], [place], DO   3 years ago Vertigo   [place] [facility] [place], [place], DO   5 years ago Right arm weakness   [place] [facility] [place], Megan P, DO   5 years ago Gastroesophageal reflux disease, esophagitis presence not specified   [place] [place], [place], DO

## 2023-01-12 NOTE — Telephone Encounter (Signed)
Already requested by pharmacy on 01/11/23 in a separate refill encounter.

## 2023-01-13 ENCOUNTER — Encounter: Payer: Self-pay | Admitting: Family Medicine

## 2023-01-13 ENCOUNTER — Ambulatory Visit: Payer: 59 | Admitting: Family Medicine

## 2023-01-13 VITALS — BP 120/79 | HR 68 | Wt 199.4 lb

## 2023-01-13 DIAGNOSIS — K219 Gastro-esophageal reflux disease without esophagitis: Secondary | ICD-10-CM

## 2023-01-13 DIAGNOSIS — Z0000 Encounter for general adult medical examination without abnormal findings: Secondary | ICD-10-CM | POA: Diagnosis not present

## 2023-01-13 DIAGNOSIS — L309 Dermatitis, unspecified: Secondary | ICD-10-CM

## 2023-01-13 MED ORDER — TRIAMCINOLONE ACETONIDE 0.5 % EX OINT: 1.0000 | TOPICAL_OINTMENT | Freq: Two times a day (BID) | CUTANEOUS | 2 refills | Status: DC

## 2023-01-13 MED ORDER — PANTOPRAZOLE SODIUM 20 MG PO TBEC: 20.0000 mg | DELAYED_RELEASE_TABLET | Freq: Every day | ORAL | 3 refills | Status: DC

## 2023-01-13 NOTE — Assessment & Plan Note (Signed)
Under good control on current regimen. Continue current regimen. Continue to monitor. Call with any concerns. Refills given.

## 2023-01-13 NOTE — Progress Notes (Signed)
BP 120/79   Pulse 68   Wt 199 lb 6.4 oz (90.4 kg)   SpO2 93%   BMI 29.45 kg/m    Subjective:    Patient ID: Jordan House, male    DOB: 1980/09/26, 42 y.o.   MRN: [id number]  HPI: Jordan House is a 43 y.o. male presenting on 01/13/2023 for comprehensive medical examination. Current medical complaints include:  GERD GERD control status: controlled Satisfied with current treatment? yes Heartburn frequency: occasionally Medication side effects: no  Medication compliance: excellent Previous GERD medications: pantoprazole Dysphagia: no Odynophagia:  no Hematemesis: no Blood in stool: no EGD: no   Interim Problems from his last visit: no  Depression Screen done today and results listed below:     01/13/2023   11:33 AM 01/08/2020   11:39 AM 02/11/2017    3:49 PM 03/18/2015   12:59 PM  Depression screen PHQ 2/9  Decreased Interest 0 0 0 0  Down, Depressed, Hopeless 0 0 0 0  PHQ - 2 Score 0 0 0 0  Altered sleeping 0 0    Tired, decreased energy 0 0    Change in appetite 0 0    Feeling bad or failure about yourself  0 0    Trouble concentrating 0 0    Moving slowly or fidgety/restless 0 0    Suicidal thoughts 0 0    PHQ-9 Score 0 0    Difficult doing work/chores Not difficult at all       Past Medical History:  Past Medical History:  Diagnosis Date   Genital warts    GERD (gastroesophageal reflux disease)     Surgical History:  Past Surgical History:  Procedure Laterality Date   TONSILLECTOMY      Medications:  No current outpatient medications on file prior to visit.   No current facility-administered medications on file prior to visit.    Allergies:  No Known Allergies  Social History:  Social History   Socioeconomic History   Marital status: Single    Spouse name: Not on file   Number of children: Not on file   Years of education: Not on file   Highest education level: Not on file  Occupational History   Not on file  Tobacco Use    Smoking status: Former    Current packs/day: 0.00    Types: Cigarettes    Quit date: 04/27/2011    Years since quitting: 11.7   Smokeless tobacco: Never  Vaping Use   Vaping status: Never Used  Substance and Sexual Activity   Alcohol use: No   Drug use: No   Sexual activity: Yes    Birth control/protection: None  Other Topics Concern   Not on file  Social History Narrative   Not on file   Social Determinants of Health   Financial Resource Strain: Not on file  Food Insecurity: Not on file  Transportation Needs: Not on file  Physical Activity: Not on file  Stress: Not on file  Social Connections: Not on file  Intimate Partner Violence: Not on file   Social History   Tobacco Use  Smoking Status Former   Current packs/day: 0.00   Types: Cigarettes   Quit date: 04/27/2011   Years since quitting: 11.7  Smokeless Tobacco Never   Social History   Substance and Sexual Activity  Alcohol Use No    Family History:  Family History  Problem Relation Age of Onset   Arthritis Mother    Hypertension Father  Diabetes Maternal Grandmother    Arthritis Maternal Grandmother    Heart disease Maternal Grandmother    Hypertension Paternal Grandmother    Heart disease Paternal Grandfather     Past medical history, surgical history, medications, allergies, family history and social history reviewed with patient today and changes made to appropriate areas of the chart.   Review of Systems  Constitutional: Negative.   HENT: Negative.    Eyes: Negative.   Respiratory: Negative.    Cardiovascular: Negative.   Gastrointestinal:  Positive for heartburn. Negative for abdominal pain, blood in stool, constipation, diarrhea, melena, nausea and vomiting.  Genitourinary: Negative.   Musculoskeletal: Negative.   Skin:  Positive for rash. Negative for itching.  Neurological: Negative.   Endo/Heme/Allergies: Negative.   Psychiatric/Behavioral: Negative.     All other ROS negative  except what is listed above and in the HPI.      Objective:    BP 120/79   Pulse 68   Wt 199 lb 6.4 oz (90.4 kg)   SpO2 93%   BMI 29.45 kg/m   Wt Readings from Last 3 Encounters:  01/13/23 199 lb 6.4 oz (90.4 kg)  06/08/21 174 lb (78.9 kg)  06/09/20 160 lb (72.6 kg)    Physical Exam Vitals and nursing note reviewed.  Constitutional:      General: He is not in acute distress.    Appearance: Normal appearance. He is not ill-appearing, toxic-appearing or diaphoretic.  HENT:     Head: Normocephalic and atraumatic.     Right Ear: Tympanic membrane, ear canal and external ear normal. There is no impacted cerumen.     Left Ear: Tympanic membrane, ear canal and external ear normal. There is no impacted cerumen.     Nose: Nose normal. No congestion or rhinorrhea.     Mouth/Throat:     Mouth: Mucous membranes are moist.     Pharynx: Oropharynx is clear. No oropharyngeal exudate or posterior oropharyngeal erythema.  Eyes:     General: No scleral icterus.       Right eye: No discharge.        Left eye: No discharge.     Extraocular Movements: Extraocular movements intact.     Conjunctiva/sclera: Conjunctivae normal.     Pupils: Pupils are equal, round, and reactive to light.  Neck:     Vascular: No carotid bruit.  Cardiovascular:     Rate and Rhythm: Normal rate and regular rhythm.     Pulses: Normal pulses.     Heart sounds: No murmur heard.    No friction rub. No gallop.  Pulmonary:     Effort: Pulmonary effort is normal. No respiratory distress.     Breath sounds: Normal breath sounds. No stridor. No wheezing, rhonchi or rales.  Chest:     Chest wall: No tenderness.  Abdominal:     General: Abdomen is flat. Bowel sounds are normal. There is no distension.     Palpations: Abdomen is soft. There is no mass.     Tenderness: There is no abdominal tenderness. There is no right CVA tenderness, left CVA tenderness, guarding or rebound.     Hernia: No hernia is present.   Genitourinary:    Comments: Genital exam deferred with shared decision making Musculoskeletal:        General: No swelling, tenderness, deformity or signs of injury.     Cervical back: Normal range of motion and neck supple. No rigidity. No muscular tenderness.     Right lower  leg: No edema.     Left lower leg: No edema.  Lymphadenopathy:     Cervical: No cervical adenopathy.  Skin:    General: Skin is warm and dry.     Capillary Refill: Capillary refill takes less than 2 seconds.     Coloration: Skin is not jaundiced or pale.     Findings: No bruising, erythema, lesion or rash.     Comments: Eczema on palms of the hands  Neurological:     General: No focal deficit present.     Mental Status: He is alert and oriented to person, place, and time.     Cranial Nerves: No cranial nerve deficit.     Sensory: No sensory deficit.     Motor: No weakness.     Coordination: Coordination normal.     Gait: Gait normal.     Deep Tendon Reflexes: Reflexes normal.  Psychiatric:        Mood and Affect: Mood normal.        Behavior: Behavior normal.        Thought Content: Thought content normal.        Judgment: Judgment normal.     Results for orders placed or performed in visit on 06/08/21  Celiac Disease Panel  Result Value Ref Range   Endomysial IgA Negative Negative   Transglutaminase IgA <2 0 - 3 U/mL   IgA/Immunoglobulin A, Serum 201 90 - 386 mg/dL      Assessment & Plan:   Problem List Items Addressed This Visit       Digestive   GERD (gastroesophageal reflux disease)    Under good control on current regimen. Continue current regimen. Continue to monitor. Call with any concerns. Refills given.        Relevant Medications   pantoprazole (PROTONIX) 20 MG tablet   Other Visit Diagnoses     Routine general medical examination at a health care facility    -  Primary   Vaccines declined. Screening labs checked today. Continue diet and exercise. Call with any concerns.    Relevant Orders   Comprehensive metabolic panel   CBC with Differential/Platelet   Lipid Panel w/o Chol/HDL Ratio   TSH   HIV Antibody (routine testing w rflx)   Hepatitis C Antibody   Eczema, unspecified type       Will treat with triamcinalone. Call if not getting better or getting worse. Continue to monitor.       LABORATORY TESTING:  Health maintenance labs ordered today as discussed above.   IMMUNIZATIONS:   - Tdap: Tetanus vaccination status reviewed: last tetanus booster within 10 years. - Influenza: Refused - Pneumovax: Not applicable - Prevnar: Not applicable - COVID: Refused - HPV: Refused - Shingrix vaccine: Not applicable  PATIENT COUNSELING:    Sexuality: Discussed sexually transmitted diseases, partner selection, use of condoms, avoidance of unintended pregnancy  and contraceptive alternatives.   Advised to avoid cigarette smoking.  I discussed with the patient that most people either abstain from alcohol or drink within safe limits (<=14/week and <=4 drinks/occasion for males, <=7/weeks and <= 3 drinks/occasion for females) and that the risk for alcohol disorders and other health effects rises proportionally with the number of drinks per week and how often a drinker exceeds daily limits.  Discussed cessation/primary prevention of drug use and availability of treatment for abuse.   Diet: Encouraged to adjust caloric intake to maintain  or achieve ideal body weight, to reduce intake of dietary saturated fat  and total fat, to limit sodium intake by avoiding high sodium foods and not adding table salt, and to maintain adequate dietary potassium and calcium preferably from fresh fruits, vegetables, and low-fat dairy products.    stressed the importance of regular exercise  Injury prevention: Discussed safety belts, safety helmets, smoke detector, smoking near bedding or upholstery.   Dental health: Discussed importance of regular tooth brushing, flossing, and dental  visits.   Follow up plan: NEXT PREVENTATIVE PHYSICAL DUE IN 1 YEAR. Return in about 1 year (around 01/13/2024) for physcial.

## 2023-01-14 LAB — COMPREHENSIVE METABOLIC PANEL
ALT: 52 IU/L — ABNORMAL HIGH (ref 0–44)
AST: 29 IU/L (ref 0–40)
Albumin: 4.4 g/dL (ref 4.1–5.1)
Alkaline Phosphatase: 110 IU/L (ref 44–121)
BUN/Creatinine Ratio: 19 (ref 9–20)
BUN: 19 mg/dL (ref 6–24)
Bilirubin Total: 0.3 mg/dL (ref 0.0–1.2)
CO2: 22 mmol/L (ref 20–29)
Calcium: 9.7 mg/dL (ref 8.7–10.2)
Chloride: 99 mmol/L (ref 96–106)
Creatinine, Ser: 0.99 mg/dL (ref 0.76–1.27)
Globulin, Total: 3 g/dL (ref 1.5–4.5)
Glucose: 83 mg/dL (ref 70–99)
Potassium: 4.5 mmol/L (ref 3.5–5.2)
Sodium: 139 mmol/L (ref 134–144)
Total Protein: 7.4 g/dL (ref 6.0–8.5)
eGFR: 97 mL/min/{1.73_m2} (ref >59)

## 2023-01-14 LAB — CBC WITH DIFFERENTIAL/PLATELET
Basophils Absolute: 0.1 10*3/uL (ref 0.0–0.2)
Basos: 1 %
EOS (ABSOLUTE): 0.3 10*3/uL (ref 0.0–0.4)
Eos: 4 %
Hematocrit: 47.6 % (ref 37.5–51.0)
Hemoglobin: 16.1 g/dL (ref 13.0–17.7)
Immature Grans (Abs): 0.1 10*3/uL (ref 0.0–0.1)
Immature Granulocytes: 1 %
Lymphocytes Absolute: 3 10*3/uL (ref 0.7–3.1)
Lymphs: 40 %
MCH: 28.6 pg (ref 26.6–33.0)
MCHC: 33.8 g/dL (ref 31.5–35.7)
MCV: 85 fL (ref 79–97)
Monocytes Absolute: 0.5 10*3/uL (ref 0.1–0.9)
Monocytes: 7 %
Neutrophils Absolute: 3.6 10*3/uL (ref 1.4–7.0)
Neutrophils: 47 %
Platelets: 311 10*3/uL (ref 150–450)
RBC: 5.63 x10E6/uL (ref 4.14–5.80)
RDW: 14.2 % (ref 11.6–15.4)
WBC: 7.5 10*3/uL (ref 3.4–10.8)

## 2023-01-14 LAB — HIV ANTIBODY (ROUTINE TESTING W REFLEX): HIV Screen 4th Generation wRfx: NONREACTIVE

## 2023-01-14 LAB — LIPID PANEL W/O CHOL/HDL RATIO
Cholesterol, Total: 277 mg/dL — ABNORMAL HIGH (ref 100–199)
HDL: 31 mg/dL — ABNORMAL LOW (ref >39)
LDL Chol Calc (NIH): 106 mg/dL — ABNORMAL HIGH (ref 0–99)
Triglycerides: 796 mg/dL (ref 0–149)
VLDL Cholesterol Cal: 140 mg/dL — ABNORMAL HIGH (ref 5–40)

## 2023-01-14 LAB — TSH: TSH: 3.48 u[IU]/mL (ref 0.450–4.500)

## 2023-01-14 LAB — HEPATITIS C ANTIBODY: Hep C Virus Ab: NONREACTIVE

## 2023-06-10 ENCOUNTER — Ambulatory Visit: Payer: Self-pay

## 2023-06-10 ENCOUNTER — Telehealth: Payer: 59 | Admitting: Physician Assistant

## 2023-06-10 DIAGNOSIS — R6889 Other general symptoms and signs: Secondary | ICD-10-CM

## 2023-06-10 MED ORDER — FLUTICASONE PROPIONATE 50 MCG/ACT NA SUSP: 2.0000 | Freq: Every day | NASAL | 6 refills | Status: DC

## 2023-06-10 MED ORDER — OSELTAMIVIR PHOSPHATE 75 MG PO CAPS: 75.0000 mg | ORAL_CAPSULE | Freq: Two times a day (BID) | ORAL | 0 refills | Status: AC

## 2023-06-10 MED ORDER — BENZONATATE 100 MG PO CAPS: 100.0000 mg | ORAL_CAPSULE | Freq: Two times a day (BID) | ORAL | 0 refills | Status: DC | PRN

## 2023-06-10 NOTE — Telephone Encounter (Signed)
Summary: congestion, cough, fever   Patient states that he had had congestion, cough, fever, chills, body aches for 3 days. Patient is seeking advice.     Called pt - left message on machine to return our call.

## 2023-06-10 NOTE — Telephone Encounter (Signed)
Summary: congestion, cough, fever   Patient states that he had had congestion, cough, fever, chills, body aches for 3 days. Patient is seeking advice.    Call cannot be completed at this time.

## 2023-06-10 NOTE — Progress Notes (Signed)
Virtual Visit Consent   Abdur Hoglund, you are scheduled for a virtual visit with a [facility] provider today. Just as with appointments in the office, your consent must be obtained to participate. Your consent will be active for this visit and any virtual visit you may have with one of our providers in the next 365 days. If you have a MyChart account, a copy of this consent can be sent to you electronically.  As this is a virtual visit, video technology does not allow for your provider to perform a traditional examination. This may limit your provider's ability to fully assess your condition. If your provider identifies any concerns that need to be evaluated in person or the need to arrange testing (such as labs, EKG, etc.), we will make arrangements to do so. Although advances in technology are sophisticated, we cannot ensure that it will always work on either your end or our end. If the connection with a video visit is poor, the visit may have to be switched to a telephone visit. With either a video or telephone visit, we are not always able to ensure that we have a secure connection.  By engaging in this virtual visit, you consent to the provision of healthcare and authorize for your insurance to be billed (if applicable) for the services provided during this visit. Depending on your insurance coverage, you may receive a charge related to this service.  I need to obtain your verbal consent now. Are you willing to proceed with your visit today? Jordan House has provided verbal consent on 06/10/2023 for a virtual visit (video or telephone). Jordan House, [place]  Date: 06/10/2023 8:43 PM   Virtual Visit via Video Note   I, Jordan House, connected with  Jordan House  ([id number], July 01, 1980) on 06/10/23 at  8:30 PM EST by a video-enabled telemedicine application and verified that I am speaking with the correct person using two identifiers.  Location: Patient: Virtual Visit Location  Patient: Home Provider: Virtual Visit Location Provider: Home Office   I discussed the limitations of evaluation and management by telemedicine and the availability of in person appointments. The patient expressed understanding and agreed to proceed.    History of Present Illness: Jordan House is a 43 y.o. who identifies as a male who was assigned male at birth, and is being seen today for flu symptoms.  HPI: Influenza The current episode started yesterday. The problem occurs constantly. Associated symptoms include chills and fatigue. The symptoms are aggravated by coughing. He has tried acetaminophen for the symptoms. The treatment provided mild relief.    Problems:  Patient Active Problem List   Diagnosis Date Noted   Hypogonadism in male 02/11/2017   Condylomata acuminata in men 03/18/2015   Premature ejaculation 03/18/2015   GERD (gastroesophageal reflux disease) 03/18/2015    Allergies: No Known Allergies Medications:  Current Outpatient Medications:    pantoprazole (PROTONIX) 20 MG tablet, Take 1-2 tablets (20-40 mg total) by mouth daily., Disp: 180 tablet, Rfl: 3   triamcinolone ointment (KENALOG) 0.5 %, Apply 1 Application topically 2 (two) times daily., Disp: 30 g, Rfl: 2  Observations/Objective: Patient is well-developed, well-nourished in no acute distress.  Resting comfortably  at home.  Head is normocephalic, atraumatic.  No labored breathing.  Speech is clear and coherent with logical content.  Patient is alert and oriented at baseline.    Assessment and Plan: 1. Flu-like symptoms (Primary)  Initial considerations in this patient included influenza, bacterial and viral etiologies of upper  respiratory infection (URI), bronchitis, pneumonia, sinusitis, toxic exposure, sepsis, meningitis, encephalitis, and other pulmonary or cardiac etiologies among others.     Influenza felt to be likely cause of symptoms in patient presenting within 48 hours of onset of  symptoms with antiviral treatment felt to be indicated after discussion of potential benefits and risks with the patient and felt to be indicated.  Prior to discharge, we discussed ER  precautions, specifically for symptoms suggestive of bacterial co-infection or worsening illness, and recommended follow up with primary care provider within 2-3 days or ER and the patient demonstrated understanding and agreement with this plan.  Follow Up Instructions: I discussed the assessment and treatment plan with the patient. The patient was provided an opportunity to ask questions and all were answered. The patient agreed with the plan and demonstrated an understanding of the instructions.  A copy of instructions were sent to the patient via MyChart unless otherwise noted below.     The patient was advised to call back or seek an in-person evaluation if the symptoms worsen or if the condition fails to improve as anticipated.    Jordan Kidney, PA-C

## 2023-06-10 NOTE — Patient Instructions (Signed)
  Darolyn Rua, thank you for joining [name], PA-C for today's virtual visit.  While this provider is not your primary care provider (PCP), if your PCP is located in our provider database this encounter information will be shared with them immediately following your visit.   A [facility] MyChart account gives you access to today's visit and all your visits, tests, and labs performed at [facility] " click here if you don't have a [facility] MyChart account or go to [email or web address]  Consent: (Patient) Jordan House provided verbal consent for this virtual visit at the beginning of the encounter.  Current Medications:  Current Outpatient Medications:    benzonatate (TESSALON) 100 MG capsule, Take 1 capsule (100 mg total) by mouth 2 (two) times daily as needed for cough., Disp: 20 capsule, Rfl: 0   fluticasone (FLONASE) 50 MCG/ACT nasal spray, Place 2 sprays into both nostrils daily., Disp: 16 g, Rfl: 6   oseltamivir (TAMIFLU) 75 MG capsule, Take 1 capsule (75 mg total) by mouth 2 (two) times daily for 5 days., Disp: 10 capsule, Rfl: 0   pantoprazole (PROTONIX) 20 MG tablet, Take 1-2 tablets (20-40 mg total) by mouth daily., Disp: 180 tablet, Rfl: 3   triamcinolone ointment (KENALOG) 0.5 %, Apply 1 Application topically 2 (two) times daily., Disp: 30 g, Rfl: 2   Medications ordered in this encounter:  Meds ordered this encounter  Medications   fluticasone (FLONASE) 50 MCG/ACT nasal spray    Sig: Place 2 sprays into both nostrils daily.    Dispense:  16 g    Refill:  6    Supervising Provider:   Merrilee Jansky [[id number]]   oseltamivir (TAMIFLU) 75 MG capsule    Sig: Take 1 capsule (75 mg total) by mouth 2 (two) times daily for 5 days.    Dispense:  10 capsule    Refill:  0    Supervising Provider:   Merrilee Jansky [[id number]]   benzonatate (TESSALON) 100 MG capsule    Sig: Take 1 capsule (100 mg total) by mouth 2 (two) times daily as needed for  cough.    Dispense:  20 capsule    Refill:  0    Supervising Provider:   Merrilee Jansky [[id number]]     *If you need refills on other medications prior to your next appointment, please contact your pharmacy*  Follow-Up: Call back or seek an in-person evaluation if the symptoms worsen or if the condition fails to improve as anticipated.  [facility] [phone]  Other Instructions Report to ER or Urgent Care with new or worsening symptoms    If you have been instructed to have an in-person evaluation today at a local Urgent Care facility, please use the link below. It will take you to a list of all of our available [facility], including address, phone number and hours of operation. Please do not delay care.  [facility]  If you or a family member do not have a primary care provider, use the link below to schedule a visit and establish care. When you choose a [facility] primary care physician or advanced practice provider, you gain a long-term partner in health. Find a Primary Care Provider  Learn more about [facility]'s in-office and virtual care options: [facility] - Get Care Now

## 2023-06-10 NOTE — Telephone Encounter (Signed)
  Chief Complaint: flu sx Symptoms: cough congestion, body aches, fever, chills, sinus pressure, chest tightness and SOB  Frequency: 3 days  Pertinent Negatives: NA Disposition: [] ED /[] Urgent Care (no appt availability in office) / [] Appointment(In office/virtual)/ [x]  [facility]/ [] Home Care/ [] Refused Recommended Disposition /[] [facility]/ []  Follow-up with PCP Additional Notes: pt states he has been dealing with sx for 3 days. Been in bed d/t not feeling well. Has been taking Ibuprofen and Decongestant but not noticed improvement. Pt tested Tuesday for Covid but was negative. Offered VUC or UC. Pt preferred VUC so he doesn't have to go anywhere. Scheduled VUC today at 830. Care advice given and pt verbalized understanding.   Reason for Disposition  Fever present > 3 days (72 hours)  Answer Assessment - Initial Assessment Questions 2. ONSET: "When did your flu symptoms start?"      3 days  3. COUGH: "How bad is the cough?"       Yes, dry cough  4. RESPIRATORY DISTRESS: "Describe your breathing."      Chest tightness, SOB  5. FEVER: "Do you have a fever?" If Yes, ask: "What is your temperature, how was it measured, and when did it start?"     Yes and chills 8. HIGH RISK DISEASE: "Do you have any chronic medical problems?" (e.g., heart or lung disease, asthma, weak immune system, or other HIGH RISK conditions)     no  Other sx: body aches, nasal congestion  Protocols used: Influenza (Flu) - [facility]

## 2024-01-03 ENCOUNTER — Ambulatory Visit: Payer: Self-pay

## 2024-01-03 NOTE — Telephone Encounter (Signed)
 FYI Only or Action Required?: FYI only for provider.  Patient was last seen in primary care on 06/10/2023 by Rolan Berthold, PA-C.  Called Nurse Triage reporting Groin Swelling.  Symptoms began today.  Interventions attempted: Nothing.  Symptoms are: unchanged.  Triage Disposition: See HCP Within 4 Hours (Or PCP Triage)  Patient/caregiver understands and will follow disposition?: Yes         Copied from CRM #[id number]. Topic: Clinical - Red Word Triage >> Jan 03, 2024  8:29 AM Hamdi H wrote: Red Word that prompted transfer to Nurse Triage: Knott underneath groin area, upper right thigh. Hard and sensitive to touch. Reason for Disposition  [1] Swelling of groin (inguinal area) AND [2] painful  Answer Assessment - Initial Assessment Questions 1. APPEARANCE of SWELLING: What does it look like?     Knot under skin 2. SIZE: How large is the swelling? (e.g., inches, cm; or compare to size of pinhead, tip of pen, eraser, coin, pea, grape, ping pong ball)      About 1 inch 3. LOCATION: Where is the swelling located?     Right upper thigh 4. ONSET: When did the swelling start?     This morning 5. COLOR: What color is it? Is there more than one color?     Red 6. PAIN: Is there any pain? If Yes, ask: How bad is the pain? (Scale 1-10; or mild, moderate, severe)       Moderate 7. ITCH: Does it itch? If Yes, ask: How bad is the itch?      None 8. CAUSE: What do you think caused the swelling?     Unknown 9 OTHER SYMPTOMS: Do you have any other symptoms? (e.g., fever)     None  Protocols used: Skin Lump or Localized Swelling-A-AH

## 2024-01-04 ENCOUNTER — Ambulatory Visit: Admitting: Family Medicine

## 2024-05-02 ENCOUNTER — Other Ambulatory Visit: Payer: Self-pay | Admitting: Family Medicine

## 2024-05-03 ENCOUNTER — Ambulatory Visit: Admitting: Family Medicine

## 2024-05-03 ENCOUNTER — Encounter: Payer: Self-pay | Admitting: Family Medicine

## 2024-05-03 VITALS — BP 132/84 | HR 96 | Temp 98.1°F | Resp 17 | Ht 69.02 in | Wt 177.8 lb

## 2024-05-03 DIAGNOSIS — Z1211 Encounter for screening for malignant neoplasm of colon: Secondary | ICD-10-CM

## 2024-05-03 DIAGNOSIS — L309 Dermatitis, unspecified: Secondary | ICD-10-CM | POA: Insufficient documentation

## 2024-05-03 DIAGNOSIS — Z0000 Encounter for general adult medical examination without abnormal findings: Secondary | ICD-10-CM

## 2024-05-03 MED ORDER — CLOBETASOL PROPIONATE 0.05 % EX CREA: 1.0000 | TOPICAL_CREAM | Freq: Two times a day (BID) | CUTANEOUS | 3 refills | Status: AC

## 2024-05-03 NOTE — Progress Notes (Signed)
 "  BP 132/84   Pulse 96   Temp 98.1 F (36.7 C) (Oral)   Resp 17   Ht 5' 9.02 (1.753 m)   Wt 177 lb 12.8 oz (80.6 kg)   SpO2 97%   BMI 26.24 kg/m    Subjective:    Patient ID: Jordan House, male    DOB: 20-Sep-1980, 44 y.o.   MRN: [id number]  HPI: Jordan House is a 44 y.o. male presenting on 05/03/2024 for comprehensive medical examination. Current medical complaints include:  Eczema on his hands is not getting better.   He currently lives with: wife and kids Interim Problems from his last visit: no  Depression Screen done today and results listed below:     05/03/2024    1:34 PM 01/13/2023   11:33 AM 01/08/2020   11:39 AM 02/11/2017    3:49 PM 03/18/2015   12:59 PM  Depression screen PHQ 2/9  Decreased Interest 0 0 0 0 0  Down, Depressed, Hopeless 0 0 0 0 0  PHQ - 2 Score 0 0 0 0 0  Altered sleeping 1 0 0    Tired, decreased energy 0 0 0    Change in appetite 0 0 0    Feeling bad or failure about yourself  0 0 0    Trouble concentrating 0 0 0    Moving slowly or fidgety/restless 0 0 0    Suicidal thoughts 0 0 0    PHQ-9 Score 1 0  0     Difficult doing work/chores Not difficult at all Not difficult at all        Data saved with a previous flowsheet row definition    Past Medical History:  Past Medical History:  Diagnosis Date   Genital warts    GERD (gastroesophageal reflux disease)     Surgical History:  Past Surgical History:  Procedure Laterality Date   TONSILLECTOMY      Medications:  Current Outpatient Medications on File Prior to Visit  Medication Sig   sildenafil (VIAGRA) 100 MG tablet Take 100 mg by mouth daily as needed for erectile dysfunction.   No current facility-administered medications on file prior to visit.    Allergies:  Allergies[1]  Social History:  Social History   Socioeconomic History   Marital status: Single    Spouse name: Not on file   Number of children: Not on file   Years of education: Not on file    Highest education level: GED or equivalent  Occupational History   Not on file  Tobacco Use   Smoking status: Former    Current packs/day: 0.00    Types: Cigarettes    Quit date: 04/27/2011    Years since quitting: 13.0   Smokeless tobacco: Never  Vaping Use   Vaping status: Never Used  Substance and Sexual Activity   Alcohol use: No   Drug use: No   Sexual activity: Yes    Birth control/protection: None  Other Topics Concern   Not on file  Social History Narrative   Not on file   Social Drivers of Health   Tobacco Use: Medium Risk (05/03/2024)   Patient History    Smoking Tobacco Use: Former    Smokeless Tobacco Use: Never    Passive Exposure: Not on file  Financial Resource Strain: Low Risk (01/04/2024)   Overall Financial Resource Strain (CARDIA)    Difficulty of Paying Living Expenses: Not hard at all  Food Insecurity: No Food Insecurity (01/04/2024)  Epic    Worried About Programme Researcher, Broadcasting/film/video in the Last Year: Never true    [employer] of Food in the Last Year: Never true  Transportation Needs: No Transportation Needs (01/04/2024)   Epic    Lack of Transportation (Medical): No    Lack of Transportation (Non-Medical): No  Physical Activity: Not on file  Stress: Not on file  Social Connections: Moderately Integrated (01/04/2024)   Social Connection and Isolation Panel    Frequency of Communication with Friends and Family: Three times a week    Frequency of Social Gatherings with Friends and Family: Once a week    Attends Religious Services: More than 4 times per year    Active Member of [employer] or Organizations: No    Attends Engineer, Structural: Not on file    Marital Status: Married  Catering Manager Violence: Not on file  Depression (PHQ2-9): Low Risk (05/03/2024)   Depression (PHQ2-9)    PHQ-2 Score: 1  Alcohol Screen: Low Risk (01/04/2024)   Alcohol Screen    Last Alcohol Screening Score (AUDIT): 2  Housing: Low Risk (01/04/2024)   Epic    Unable to Pay for  Housing in the Last Year: No    Number of Times Moved in the Last Year: 0    Homeless in the Last Year: No  Utilities: Not on file  Health Literacy: Not on file   Tobacco Use History[2] Social History   Substance and Sexual Activity  Alcohol Use No    Family History:  Family History  Problem Relation Age of Onset   Arthritis Mother    Hypertension Father    Diabetes Maternal Grandmother    Arthritis Maternal Grandmother    Heart disease Maternal Grandmother    Hypertension Paternal Grandmother    Heart disease Paternal Grandfather     Past medical history, surgical history, medications, allergies, family history and social history reviewed with patient today and changes made to appropriate areas of the chart.   Review of Systems  Constitutional: Negative.   HENT: Negative.    Eyes: Negative.   Respiratory: Negative.    Cardiovascular: Negative.   Gastrointestinal: Negative.   Genitourinary: Negative.   Musculoskeletal: Negative.   Skin:  Positive for rash. Negative for itching.  Neurological: Negative.   Endo/Heme/Allergies: Negative.   Psychiatric/Behavioral:  Negative for depression, hallucinations, memory loss, substance abuse and suicidal ideas. The patient is nervous/anxious. The patient does not have insomnia.    All other ROS negative except what is listed above and in the HPI.      Objective:    BP 132/84   Pulse 96   Temp 98.1 F (36.7 C) (Oral)   Resp 17   Ht 5' 9.02 (1.753 m)   Wt 177 lb 12.8 oz (80.6 kg)   SpO2 97%   BMI 26.24 kg/m   Wt Readings from Last 3 Encounters:  05/03/24 177 lb 12.8 oz (80.6 kg)  01/13/23 199 lb 6.4 oz (90.4 kg)  06/08/21 174 lb (78.9 kg)    Physical Exam Vitals and nursing note reviewed.  Constitutional:      General: He is not in acute distress.    Appearance: Normal appearance. He is not ill-appearing, toxic-appearing or diaphoretic.  HENT:     Head: Normocephalic and atraumatic.     Right Ear: Tympanic  membrane, ear canal and external ear normal. There is no impacted cerumen.     Left Ear: Tympanic membrane, ear canal and external  ear normal. There is no impacted cerumen.     Nose: Nose normal. No congestion or rhinorrhea.     Mouth/Throat:     Mouth: Mucous membranes are moist.     Pharynx: Oropharynx is clear. No oropharyngeal exudate or posterior oropharyngeal erythema.  Eyes:     General: No scleral icterus.       Right eye: No discharge.        Left eye: No discharge.     Extraocular Movements: Extraocular movements intact.     Conjunctiva/sclera: Conjunctivae normal.     Pupils: Pupils are equal, round, and reactive to light.  Neck:     Vascular: No carotid bruit.  Cardiovascular:     Rate and Rhythm: Normal rate and regular rhythm.     Pulses: Normal pulses.     Heart sounds: No murmur heard.    No friction rub. No gallop.  Pulmonary:     Effort: Pulmonary effort is normal. No respiratory distress.     Breath sounds: Normal breath sounds. No stridor. No wheezing, rhonchi or rales.  Chest:     Chest wall: No tenderness.  Abdominal:     General: Abdomen is flat. Bowel sounds are normal. There is no distension.     Palpations: Abdomen is soft. There is no mass.     Tenderness: There is no abdominal tenderness. There is no right CVA tenderness, left CVA tenderness, guarding or rebound.     Hernia: No hernia is present.  Genitourinary:    Comments: Genital exam deferred with shared decision making Musculoskeletal:        General: No swelling, tenderness, deformity or signs of injury.     Cervical back: Normal range of motion and neck supple. No rigidity. No muscular tenderness.     Right lower leg: No edema.     Left lower leg: No edema.  Lymphadenopathy:     Cervical: No cervical adenopathy.  Skin:    General: Skin is warm and dry.     Capillary Refill: Capillary refill takes less than 2 seconds.     Coloration: Skin is not jaundiced or pale.     Findings: No bruising,  erythema, lesion or rash.  Neurological:     General: No focal deficit present.     Mental Status: He is alert and oriented to person, place, and time.     Cranial Nerves: No cranial nerve deficit.     Sensory: No sensory deficit.     Motor: No weakness.     Coordination: Coordination normal.     Gait: Gait normal.     Deep Tendon Reflexes: Reflexes normal.  Psychiatric:        Mood and Affect: Mood normal.        Behavior: Behavior normal.        Thought Content: Thought content normal.        Judgment: Judgment normal.     Results for orders placed or performed in visit on 01/13/23  Comprehensive metabolic panel   Collection Time: 01/13/23 11:49 AM  Result Value Ref Range   Glucose 83 70 - 99 mg/dL   BUN 19 6 - 24 mg/dL   Creatinine, Ser 9.00 0.76 - 1.27 mg/dL   eGFR 97 >40 fO/fpw/8.26   BUN/Creatinine Ratio 19 9 - 20   Sodium 139 134 - 144 mmol/L   Potassium 4.5 3.5 - 5.2 mmol/L   Chloride 99 96 - 106 mmol/L   CO2 22 20 - 29  mmol/L   Calcium 9.7 8.7 - 10.2 mg/dL   Total Protein 7.4 6.0 - 8.5 g/dL   Albumin 4.4 4.1 - 5.1 g/dL   Globulin, Total 3.0 1.5 - 4.5 g/dL   Bilirubin Total 0.3 0.0 - 1.2 mg/dL   Alkaline Phosphatase 110 44 - 121 IU/L   AST 29 0 - 40 IU/L   ALT 52 (H) 0 - 44 IU/L  CBC with Differential/Platelet   Collection Time: 01/13/23 11:49 AM  Result Value Ref Range   WBC 7.5 3.4 - 10.8 x10E3/uL   RBC 5.63 4.14 - 5.80 x10E6/uL   Hemoglobin 16.1 13.0 - 17.7 g/dL   Hematocrit 52.3 62.4 - 51.0 %   MCV 85 79 - 97 fL   MCH 28.6 26.6 - 33.0 pg   MCHC 33.8 31.5 - 35.7 g/dL   RDW 85.7 88.3 - 84.5 %   Platelets 311 150 - 450 x10E3/uL   Neutrophils 47 Not Estab. %   Lymphs 40 Not Estab. %   Monocytes 7 Not Estab. %   Eos 4 Not Estab. %   Basos 1 Not Estab. %   Neutrophils Absolute 3.6 1.4 - 7.0 x10E3/uL   Lymphocytes Absolute 3.0 0.7 - 3.1 x10E3/uL   Monocytes Absolute 0.5 0.1 - 0.9 x10E3/uL   EOS (ABSOLUTE) 0.3 0.0 - 0.4 x10E3/uL   Basophils Absolute 0.1  0.0 - 0.2 x10E3/uL   Immature Granulocytes 1 Not Estab. %   Immature Grans (Abs) 0.1 0.0 - 0.1 x10E3/uL  Lipid Panel w/o Chol/HDL Ratio   Collection Time: 01/13/23 11:49 AM  Result Value Ref Range   Cholesterol, Total 277 (H) 100 - 199 mg/dL   Triglycerides 203 (HH) 0 - 149 mg/dL   HDL 31 (L) >60 mg/dL   VLDL Cholesterol Cal 140 (H) 5 - 40 mg/dL   LDL Chol Calc (NIH) 893 (H) 0 - 99 mg/dL  TSH   Collection Time: 01/13/23 11:49 AM  Result Value Ref Range   TSH 3.480 0.450 - 4.500 uIU/mL  HIV Antibody (routine testing w rflx)   Collection Time: 01/13/23 11:49 AM  Result Value Ref Range   HIV Screen 4th Generation wRfx Non Reactive Non Reactive  Hepatitis C Antibody   Collection Time: 01/13/23 11:49 AM  Result Value Ref Range   Hep C Virus Ab Non Reactive Non Reactive      Assessment & Plan:   Problem List Items Addressed This Visit       Musculoskeletal and Integument   Eczema of both hands   Will switch to clobetasol  and refer him to dermatology. Call with any concerns.       Relevant Orders   Ambulatory referral to Dermatology   Other Visit Diagnoses       Routine general medical examination at a health care facility    -  Primary   Vaccines declined. Screening labs checked today. Cologuard ordered. Continue diet and exercise. Call with any concerns.   Relevant Orders   Comprehensive metabolic panel with GFR   CBC with Differential/Platelet   Lipid Panel w/o Chol/HDL Ratio   PSA   TSH   Hepatitis B surface antibody,quantitative     Screening for colon cancer       Cologuard ordered today.   Relevant Orders   Cologuard       LABORATORY TESTING:  Health maintenance labs ordered today as discussed above.   The natural history of prostate cancer and ongoing controversy regarding screening and potential treatment outcomes of  prostate cancer has been discussed with the patient. The meaning of a false positive PSA and a false negative PSA has been discussed. He  indicates understanding of the limitations of this screening test and wishes to proceed with screening PSA testing.   IMMUNIZATIONS:   - Tdap: Tetanus vaccination status reviewed: declined. - Influenza: Refused - COVID: Refused - HPV: Not applicable - Shingrix vaccine: Not applicable  SCREENING: - Colonoscopy: Ordered today  Discussed with patient purpose of the colonoscopy is to detect colon cancer at curable precancerous or early stages   PATIENT COUNSELING:    Sexuality: Discussed sexually transmitted diseases, partner selection, use of condoms, avoidance of unintended pregnancy  and contraceptive alternatives.   Advised to avoid cigarette smoking.  I discussed with the patient that most people either abstain from alcohol or drink within safe limits (<=14/week and <=4 drinks/occasion for males, <=7/weeks and <= 3 drinks/occasion for females) and that the risk for alcohol disorders and other health effects rises proportionally with the number of drinks per week and how often a drinker exceeds daily limits.  Discussed cessation/primary prevention of drug use and availability of treatment for abuse.   Diet: Encouraged to adjust caloric intake to maintain  or achieve ideal body weight, to reduce intake of dietary saturated fat and total fat, to limit sodium intake by avoiding high sodium foods and not adding table salt, and to maintain adequate dietary potassium and calcium preferably from fresh fruits, vegetables, and low-fat dairy products.    stressed the importance of regular exercise  Injury prevention: Discussed safety belts, safety helmets, smoke detector, smoking near bedding or upholstery.   Dental health: Discussed importance of regular tooth brushing, flossing, and dental visits.   Follow up plan: NEXT PREVENTATIVE PHYSICAL DUE IN 1 YEAR. Return in about 1 year (around 05/03/2025) for physical.     [1] No Known Allergies [2]  Social History Tobacco Use  Smoking  Status Former   Current packs/day: 0.00   Types: Cigarettes   Quit date: 04/27/2011   Years since quitting: 13.0  Smokeless Tobacco Never   "

## 2024-05-03 NOTE — Assessment & Plan Note (Signed)
 Will switch to clobetasol  and refer him to dermatology. Call with any concerns.

## 2024-05-04 LAB — CBC WITH DIFFERENTIAL/PLATELET
Basophils Absolute: 0 x10E3/uL (ref 0.0–0.2)
Basos: 0 %
EOS (ABSOLUTE): 0.1 x10E3/uL (ref 0.0–0.4)
Eos: 1 %
Hematocrit: 50.1 % (ref 37.5–51.0)
Hemoglobin: 16.4 g/dL (ref 13.0–17.7)
Immature Grans (Abs): 0.1 x10E3/uL (ref 0.0–0.1)
Immature Granulocytes: 1 %
Lymphocytes Absolute: 2.6 x10E3/uL (ref 0.7–3.1)
Lymphs: 24 %
MCH: 29.1 pg (ref 26.6–33.0)
MCHC: 32.7 g/dL (ref 31.5–35.7)
MCV: 89 fL (ref 79–97)
Monocytes Absolute: 0.5 x10E3/uL (ref 0.1–0.9)
Monocytes: 5 %
Neutrophils Absolute: 7.5 x10E3/uL — ABNORMAL HIGH (ref 1.4–7.0)
Neutrophils: 69 %
Platelets: 320 x10E3/uL (ref 150–450)
RBC: 5.63 x10E6/uL (ref 4.14–5.80)
RDW: 13.9 % (ref 11.6–15.4)
WBC: 10.7 x10E3/uL (ref 3.4–10.8)

## 2024-05-04 LAB — COMPREHENSIVE METABOLIC PANEL WITH GFR
ALT: 19 IU/L (ref 0–44)
AST: 27 IU/L (ref 0–40)
Albumin: 4.9 g/dL (ref 4.1–5.1)
Alkaline Phosphatase: 102 IU/L (ref 47–123)
BUN/Creatinine Ratio: 15 (ref 9–20)
BUN: 17 mg/dL (ref 6–24)
Bilirubin Total: 0.5 mg/dL (ref 0.0–1.2)
CO2: 23 mmol/L (ref 20–29)
Calcium: 9.7 mg/dL (ref 8.7–10.2)
Chloride: 102 mmol/L (ref 96–106)
Creatinine, Ser: 1.17 mg/dL (ref 0.76–1.27)
Globulin, Total: 2.8 g/dL (ref 1.5–4.5)
Glucose: 104 mg/dL — ABNORMAL HIGH (ref 70–99)
Potassium: 3.9 mmol/L (ref 3.5–5.2)
Sodium: 140 mmol/L (ref 134–144)
Total Protein: 7.7 g/dL (ref 6.0–8.5)
eGFR: 78 mL/min/1.73

## 2024-05-04 LAB — LIPID PANEL W/O CHOL/HDL RATIO
Cholesterol, Total: 240 mg/dL — ABNORMAL HIGH (ref 100–199)
HDL: 45 mg/dL
LDL Chol Calc (NIH): 171 mg/dL — ABNORMAL HIGH (ref 0–99)
Triglycerides: 133 mg/dL (ref 0–149)
VLDL Cholesterol Cal: 24 mg/dL (ref 5–40)

## 2024-05-04 LAB — HEPATITIS B SURFACE ANTIBODY, QUANTITATIVE: Hepatitis B Surf Ab Quant: 20.5 m[IU]/mL

## 2024-05-04 LAB — TSH: TSH: 2.64 u[IU]/mL (ref 0.450–4.500)

## 2024-05-04 LAB — PSA: Prostate Specific Ag, Serum: 1.5 ng/mL (ref 0.0–4.0)

## 2024-05-07 ENCOUNTER — Ambulatory Visit: Payer: Self-pay | Admitting: Family Medicine

## 2024-05-07 ENCOUNTER — Ambulatory Visit

## 2024-05-07 DIAGNOSIS — L409 Psoriasis, unspecified: Secondary | ICD-10-CM

## 2024-05-07 DIAGNOSIS — B078 Other viral warts: Secondary | ICD-10-CM

## 2024-05-07 MED ORDER — IMIQUIMOD 5 % EX CREA: TOPICAL_CREAM | CUTANEOUS | 0 refills | Status: DC

## 2024-05-07 MED ORDER — CLOBETASOL PROPIONATE 0.05 % EX OINT: TOPICAL_OINTMENT | CUTANEOUS | 5 refills | Status: AC

## 2024-05-07 MED ORDER — IMIQUIMOD 5 % EX CREA: TOPICAL_CREAM | CUTANEOUS | 0 refills | Status: AC

## 2024-05-07 MED ORDER — VTAMA 1 % EX CREA: TOPICAL_CREAM | CUTANEOUS | 5 refills | Status: AC

## 2024-05-07 NOTE — Progress Notes (Signed)
" °  °  Subjective   Jordan House is a 44 y.o. male who presents for the following: Rash. Patient is new patient  Today patient reports: Rash on bilateral hands x1 year, has tried triamcinolone  and clobetasol  from his PCP.  LOC in the groin  Review of Systems:    No other skin or systemic complaints except as noted in HPI or Assessment and Plan.  The following portions of the chart were reviewed this encounter and updated as appropriate: medications, allergies, medical history  Relevant Medical History:  n/a   Objective  (SKPE) Well appearing patient in no apparent distress; mood and affect are within normal limits. Examination was performed of the: Focused Exam of: bilateral hands  and Genital exam male: groin, mons pubis, penis, scrotum, buttocks   Examination notable for: erythematous papules, plaques on hands w/ collarettes of scale Nail pitting  Scaly plaques on anterior shaft of penis Verrucous papules on penis  Examination limited by: Clothing and Patient deferred removal       Assessment & Plan  (SKAP)   Rash of hands, genitals w/ nail pitting - favor psoriasis  Chronic and persistent condition with duration or expected duration over one year. Condition is symptomatic and bothersome to patient. Patient is flaring and not currently at treatment goal.  - Educated, discussed chronic nature and waxing/waning. - Discussed association with psoriatic arthritis, monitor for increasing joint pain/stiffness, red/hot swollen fingers or joints; discussed if develops joint involvement will need referral to Rheumatology and possible escalation of therapy - Discussed different treatments including topical corticosteroids, and/or topical vitamin D vs nbUVB vs systemic therapy (CsA, MTX, etanercept, adalimumab, ustekinumab, ixekizumab, apremilast) - Clobetasol  ointment 0.05% twice daily for 2 weeks to thick plaques. Can similarly retreat for flares. usage -  Start clobetasol  ointment  0.05% twice daily to affected skin of the hands and groin for up to 2 weeks as needed for flares  Discussed side effect of super potent topical steroids including atrophy, dyspigmentation, striae, telangectasia, folliculitis, loss of skin pigment, hair growth, tachyphylaxis, risk of systemic absorption with missuse. Start tapinarof  cream 1% twice daily to affected areas for maintenance  Educated on AE's commonly seen in trials, consisting of headaches, folliculitis, contact dermatitis, respiratory infection, nasopharyngitis, pruritus, influenza   Genital warts  - Discussed diagnosis, typical course, and treatment options for this condition - Informed patient that multiple treatments will be necessary, study shows an average of roughly 5 treatments Start lmiquimod 5% three times weekly Educated regarding proper use of lmiquimod (Aldara ) cream, leaving cream on for no more than 6-8 hours, and then thoroughly rinsing in AM. Counseled patient re: side effects, including local inflammatory reaction, application site reaction, flu-like symptoms, pain, risk for HSV reactivation, pruritus, burning. Patient informed it is ok to apply Vaseline or other bland emollient cream to irritated skin. Advised patient that if reaction is brisk and exuberant, to immediately stop further application and call office.  Was sun protection counseling provided?: Yes   Level of service outlined above   Patient instructions (SKPI)   Procedures, orders, diagnosis for this visit:    There are no diagnoses linked to this encounter.  Return to clinic: Return 5-6 weeks, for Psoriasis.  I, Emerick Ege, CMA am acting as scribe for Lauraine JAYSON Kanaris, MD.   Documentation: I have reviewed the above documentation for accuracy and completeness, and I agree with the above.  Lauraine JAYSON Kanaris, MD  "

## 2024-05-07 NOTE — Patient Instructions (Addendum)
 Your prescription was sent to [facility] in [place]. A representative from [facility] will contact you within 3 business hours to verify your address and insurance information to schedule a free delivery. If for any reason you do not receive a phone call from them, please reach out to them. Their phone number is [phone] and their hours are Monday-Friday 9:00 am-5:00 pm.     Due to recent changes in healthcare laws, you may see results of your pathology and/or laboratory studies on MyChart before the doctors have had a chance to review them. We understand that in some cases there may be results that are confusing or concerning to you. Please understand that not all results are received at the same time and often the doctors may need to interpret multiple results in order to provide you with the best plan of care or course of treatment. Therefore, we ask that you please give us  2 business days to thoroughly review all your results before contacting the office for clarification. Should we see a critical lab result, you will be contacted sooner.   If You Need Anything After Your Visit  If you have any questions or concerns for your doctor, please call our main line at [phone] and press option 4 to reach your doctor's medical assistant. If no one answers, please leave a voicemail as directed and we will return your call as soon as possible. Messages left after 4 pm will be answered the following business day.   You may also send us  a message via MyChart. We typically respond to MyChart messages within 1-2 business days.  For prescription refills, please ask your pharmacy to contact our office. Our fax number is [phone].  If you have an urgent issue when the clinic is closed that cannot wait until the next business day, you can page your doctor at the number below.    Please note that while we do our best to be available for urgent issues outside of office hours, we are not  available 24/7.   If you have an urgent issue and are unable to reach us , you may choose to seek medical care at your doctor's office, retail clinic, urgent care center, or emergency room.  If you have a medical emergency, please immediately call 911 or go to the emergency department.  Pager Numbers  - Dr. Hester: [phone]  - Dr. Jackquline: [phone]  - Dr. Claudene: [phone]   - Dr. Raymund: [phone]  In the event of inclement weather, please call our main line at [phone] for an update on the status of any delays or closures.  Dermatology Medication Tips: Please keep the boxes that topical medications come in in order to help keep track of the instructions about where and how to use these. Pharmacies typically print the medication instructions only on the boxes and not directly on the medication tubes.   If your medication is too expensive, please contact our office at [phone] option 4 or send us  a message through MyChart.   We are unable to tell what your co-pay for medications will be in advance as this is different depending on your insurance coverage. However, we may be able to find a substitute medication at lower cost or fill out paperwork to get insurance to cover a needed medication.   If a prior authorization is required to get your medication covered by your insurance company, please allow us  1-2 business days to complete this process.  Drug prices often vary depending  on where the prescription is filled and some pharmacies may offer cheaper prices.  The website www.goodrx.com contains coupons for medications through different pharmacies. The prices here do not account for what the cost may be with help from insurance (it may be cheaper with your insurance), but the website can give you the price if you did not use any insurance.  - You can print the associated coupon and take it with your prescription to the pharmacy.  - You may also stop by our office  during regular business hours and pick up a GoodRx coupon card.  - If you need your prescription sent electronically to a different pharmacy, notify our office through [facility] or by phone at [phone] option 4.     Si Usted Necesita Algo Despus de Su Visita  Tambin puede enviarnos un mensaje a travs de Clinical cytogeneticist. Por lo general respondemos a los mensajes de MyChart en el transcurso de 1 a 2 das hbiles.  Para renovar recetas, por favor pida a su farmacia que se ponga en contacto con nuestra oficina. Randi lakes de fax es [place] [phone].  Si tiene un asunto urgente cuando la clnica est cerrada y que no puede esperar hasta el siguiente da hbil, puede llamar/localizar a su doctor(a) al nmero que aparece a continuacin.   Por favor, tenga en cuenta que aunque hacemos todo lo posible para estar disponibles para asuntos urgentes fuera del horario de [place], no estamos disponibles las 24 horas del da, los 7 [address]  [address] de la [place].   Si tiene un problema urgente y no puede comunicarse con nosotros, puede optar por buscar atencin mdica  en el consultorio de su doctor(a), en una clnica privada, en un centro de atencin urgente o en una sala de emergencias.  Si tiene Engineer, drilling, por favor llame inmediatamente al 911 o vaya a la sala de emergencias.  Nmeros de bper  - Dr. Hester: [phone]  - Dra. Jackquline: [phone]  - Dr. Claudene: [phone]  - Dra. Kitts: [phone]  En caso de inclemencias del [place], por favor llame a nuestra lnea principal al [phone] para una actualizacin sobre el estado de cualquier retraso o cierre.  Consejos para la medicacin en dermatologa: Por favor, guarde las cajas en las que vienen los medicamentos de uso tpico para ayudarle a seguir las instrucciones sobre dnde y cmo usarlos. Las farmacias generalmente imprimen las instrucciones del medicamento slo en las cajas y no directamente en los tubos del  [place].   Si su medicamento es muy caro, por favor, pngase en contacto con landry rieger llamando al [phone] y presione la opcin 4 o envenos un mensaje a travs de Clinical cytogeneticist.   No podemos decirle cul ser su copago por los medicamentos por adelantado ya que esto es diferente dependiendo de la cobertura de su seguro. Sin embargo, es posible que podamos encontrar un medicamento sustituto a Audiological scientist un formulario para que el seguro cubra el medicamento que se considera necesario.   Si se requiere una autorizacin previa para que su compaa de seguros malta su medicamento, por favor permtanos de 1 a 2 das hbiles para completar este proceso.  Los precios de los medicamentos varan con frecuencia dependiendo del Environmental consultant de dnde se surte la receta y alguna farmacias pueden ofrecer precios ms baratos.  El sitio web www.goodrx.com tiene cupones para medicamentos de Health and safety inspector. Los precios aqu no tienen en cuenta lo que podra costar con la ayuda del seguro (puede ser ms  barato con su seguro), pero el sitio web puede darle el precio si no Visual merchandiser.  - Puede imprimir el cupn correspondiente y llevarlo con su receta a la farmacia.  - Tambin puede pasar por nuestra oficina durante el horario de atencin regular y Education officer, museum una tarjeta de cupones de GoodRx.  - Si necesita que su receta se enve electrnicamente a una farmacia diferente, informe a nuestra oficina a travs de MyChart de [place] o por telfono llamando al [phone] y presione la opcin 4.

## 2024-05-07 NOTE — Addendum Note (Signed)
 Addended by: RAYMUND LAURAINE BROCKS on: 05/07/2024 08:34 AM   Modules accepted: Orders

## 2024-06-11 ENCOUNTER — Ambulatory Visit
